# Patient Record
Sex: Male | Born: 1970 | Race: Black or African American | Hispanic: No | Marital: Married | Smoking: Never smoker
Health system: Southern US, Community
[De-identification: ages and names within clinical notes are randomized; demographics above are authoritative.]

## PROBLEM LIST (undated history)

## (undated) DIAGNOSIS — N400 Enlarged prostate without lower urinary tract symptoms: Secondary | ICD-10-CM

## (undated) DIAGNOSIS — I10 Essential (primary) hypertension: Secondary | ICD-10-CM

## (undated) DIAGNOSIS — E119 Type 2 diabetes mellitus without complications: Secondary | ICD-10-CM

## (undated) DIAGNOSIS — D66 Hereditary factor VIII deficiency: Secondary | ICD-10-CM

---

## 1997-12-09 ENCOUNTER — Emergency Department (HOSPITAL_COMMUNITY): Admission: EM | Admit: 1997-12-09 | Discharge: 1997-12-09 | Payer: Self-pay | Admitting: Emergency Medicine

## 1998-04-08 ENCOUNTER — Emergency Department (HOSPITAL_COMMUNITY): Admission: EM | Admit: 1998-04-08 | Discharge: 1998-04-08 | Payer: Self-pay | Admitting: Emergency Medicine

## 1998-04-23 ENCOUNTER — Emergency Department (HOSPITAL_COMMUNITY): Admission: EM | Admit: 1998-04-23 | Discharge: 1998-04-23 | Payer: Self-pay | Admitting: Emergency Medicine

## 2004-02-18 ENCOUNTER — Emergency Department (HOSPITAL_COMMUNITY): Admission: EM | Admit: 2004-02-18 | Discharge: 2004-02-18 | Payer: Self-pay | Admitting: Family Medicine

## 2004-04-03 ENCOUNTER — Emergency Department (HOSPITAL_COMMUNITY): Admission: EM | Admit: 2004-04-03 | Discharge: 2004-04-03 | Payer: Self-pay | Admitting: Emergency Medicine

## 2004-05-08 ENCOUNTER — Emergency Department (HOSPITAL_COMMUNITY): Admission: EM | Admit: 2004-05-08 | Discharge: 2004-05-08 | Payer: Self-pay | Admitting: Emergency Medicine

## 2009-02-06 ENCOUNTER — Emergency Department (HOSPITAL_COMMUNITY): Admission: EM | Admit: 2009-02-06 | Discharge: 2009-02-07 | Payer: Self-pay | Admitting: Emergency Medicine

## 2010-08-09 ENCOUNTER — Encounter: Payer: Self-pay | Admitting: Chiropractic Medicine

## 2019-12-11 ENCOUNTER — Encounter (HOSPITAL_COMMUNITY): Payer: Self-pay | Admitting: Emergency Medicine

## 2019-12-11 ENCOUNTER — Emergency Department (HOSPITAL_COMMUNITY)
Admission: EM | Admit: 2019-12-11 | Discharge: 2019-12-11 | Disposition: A | Discharge status: Home / Self Care | Payer: Self-pay | Attending: Emergency Medicine | Admitting: Emergency Medicine

## 2019-12-11 ENCOUNTER — Other Ambulatory Visit: Payer: Self-pay

## 2019-12-11 ENCOUNTER — Emergency Department (HOSPITAL_COMMUNITY): Payer: Self-pay

## 2019-12-11 DIAGNOSIS — R519 Headache, unspecified: Secondary | ICD-10-CM | POA: Insufficient documentation

## 2019-12-11 DIAGNOSIS — I10 Essential (primary) hypertension: Secondary | ICD-10-CM | POA: Insufficient documentation

## 2019-12-11 DIAGNOSIS — Z79899 Other long term (current) drug therapy: Secondary | ICD-10-CM | POA: Insufficient documentation

## 2019-12-11 HISTORY — DX: Essential (primary) hypertension: I10

## 2019-12-11 LAB — CBC
HCT: 49 % (ref 39.0–52.0)
Hemoglobin: 16.9 g/dL (ref 13.0–17.0)
MCH: 29.9 pg (ref 26.0–34.0)
MCHC: 34.5 g/dL (ref 30.0–36.0)
MCV: 86.6 fL (ref 80.0–100.0)
Platelets: 261 10*3/uL (ref 150–400)
RBC: 5.66 MIL/uL (ref 4.22–5.81)
RDW: 12.3 % (ref 11.5–15.5)
WBC: 6.5 10*3/uL (ref 4.0–10.5)
nRBC: 0 % (ref 0.0–0.2)

## 2019-12-11 LAB — BASIC METABOLIC PANEL
Anion gap: 7 (ref 5–15)
BUN: 15 mg/dL (ref 6–20)
CO2: 26 mmol/L (ref 22–32)
Calcium: 9.3 mg/dL (ref 8.9–10.3)
Chloride: 103 mmol/L (ref 98–111)
Creatinine, Ser: 1.32 mg/dL — ABNORMAL HIGH (ref 0.61–1.24)
GFR calc Af Amer: >60 mL/min (ref >60)
GFR calc non Af Amer: >60 mL/min (ref >60)
Glucose, Bld: 115 mg/dL — ABNORMAL HIGH (ref 70–99)
Potassium: 4.3 mmol/L (ref 3.5–5.1)
Sodium: 136 mmol/L (ref 135–145)

## 2019-12-11 MED ORDER — KETOROLAC TROMETHAMINE 15 MG/ML IJ SOLN
15.0000 mg | Freq: Once | INTRAMUSCULAR | Status: AC
  Administered 2019-12-11: 15 mg via INTRAVENOUS
  Filled 2019-12-11: qty 1

## 2019-12-11 MED ORDER — METOCLOPRAMIDE HCL 5 MG/ML IJ SOLN
10.0000 mg | Freq: Once | INTRAMUSCULAR | Status: AC
  Administered 2019-12-11: 10 mg via INTRAVENOUS
  Filled 2019-12-11: qty 2

## 2019-12-11 MED ORDER — DIPHENHYDRAMINE HCL 50 MG/ML IJ SOLN
25.0000 mg | Freq: Once | INTRAMUSCULAR | Status: AC
  Administered 2019-12-11: 25 mg via INTRAVENOUS
  Filled 2019-12-11: qty 1

## 2019-12-11 NOTE — ED Triage Notes (Signed)
Pt reports his BP has been high, hx HTN. States he takes toprol and is prescribed lasix but has not been taking. C/o headache, A&O x 4, no neuro deficits noted.

## 2019-12-11 NOTE — ED Notes (Signed)
Ambulated pt in hallway with no difficulties. Pt denies dizziness pt states only having headache. HR 80, 02 100% RA.

## 2019-12-11 NOTE — Discharge Instructions (Signed)
Please read and follow all provided instructions.  Your diagnoses today include:  1. Acute nonintractable headache, unspecified headache type   2. Essential hypertension     Tests performed today include:  CT of your head which was normal and did not show any serious cause of your headache  Blood counts and electrolytes - your kidney function test was just slightly weak  Vital signs. See below for your results today.   Medications:  In the Emergency Department you received:  Reglan - antinausea/headache medication  Benadryl - antihistamine to counteract potential side effects of reglan  Toradol - NSAID medication similar to ibuprofen  Take any prescribed medications only as directed.  Additional information:  Follow any educational materials contained in this packet.  You are having a headache. No specific cause was found today for your headache. It may have been a migraine or other cause of headache. Stress, anxiety, fatigue, and depression are common triggers for headaches.   Your headache today does not appear to be life-threatening or require hospitalization, but often the exact cause of headaches is not determined in the emergency department. Therefore, follow-up with your doctor is very important to find out what may have caused your headache and whether or not you need any further diagnostic testing or treatment.   Sometimes headaches can appear benign (not harmful), but then more serious symptoms can develop which should prompt an immediate re-evaluation by your doctor or the emergency department.  BE VERY CAREFUL not to take multiple medicines containing Tylenol (also called acetaminophen). Doing so can lead to an overdose which can damage your liver and cause liver failure and possibly death.   Follow-up instructions: Please follow-up with your primary care provider in the next 3 days for further evaluation of your symptoms.   Return instructions:   Please return  to the Emergency Department if you experience worsening symptoms.  Return if the medications do not resolve your headache, if it recurs, or if you have multiple episodes of vomiting or cannot keep down fluids.  Return if you have a change from the usual headache.  RETURN IMMEDIATELY IF you:  Develop a sudden, severe headache  Develop confusion or become poorly responsive or faint  Develop a fever above 100.75F or problem breathing  Have a change in speech, vision, swallowing, or understanding  Develop new weakness, numbness, tingling, incoordination in your arms or legs  Have a seizure  Please return if you have any other emergent concerns.  Additional Information:  Your vital signs today were: BP 123/78    Pulse (!) 51    Temp 97.7 F (36.5 C) (Oral)    Resp 20    SpO2 100%  If your blood pressure (BP) was elevated above 135/85 this visit, please have this repeated by your doctor within one month. --------------

## 2019-12-11 NOTE — ED Provider Notes (Signed)
[facility] EMERGENCY DEPARTMENT Provider Note   CSN: [id number] Arrival date & time: 12/11/19  0246     History Chief Complaint  Patient presents with  . Hypertension    Charles Farmer is a 49 y.o. male.  Charles Farmer is a 49 year old male with history of hypertension and hemophilia presents to the emergency department today for severe headache and elevated blood pressures.  Patient reports that since getting out of jail on March 5, he has had multiple headaches.  He relates these to having high blood pressure.  Last night he had a severe headache that woke him from sleep.  He describes the headache as stabbing in the front of the head.  He does not have any associated light or sound sensitivities, or nausea.  No vision changes.  No weakness, numbness, or tingling in the arms of the legs.  He is able to walk.  He drove himself to the emergency department today.  Previously he was prescribed metoprolol and Lasix.  He has been taking the metoprolol consistently but states that he had stopped taking Lasix over concerns of his kidney function and potassium which have not been rechecked since being in prison.  Patient has had similar headaches in the past however they have been more frequent and severe recently.  He denies any fevers, confusion, neck pain.  No chest pain or shortness of breath.  No abdominal pain or diarrhea.  He denies easy bruising or bleeding, blood in the stool or urine.  He states that he took his blood pressure medication prior to arriving in the ED this morning.        Past Medical History:  Diagnosis Date  . Hypertension     There are no problems to display for this patient.   History reviewed. No pertinent surgical history.     No family history on file.  Social History   Tobacco Use  . Smoking status: Not on file  Substance Use Topics  . Alcohol use: Not Currently  . Drug use: Not Currently    Home Medications Prior to Admission  medications   Not on File    Allergies    Codeine  Review of Systems   Review of Systems  Constitutional: Negative for fever.  HENT: Negative for congestion, dental problem, rhinorrhea and sinus pressure.   Eyes: Negative for photophobia, discharge, redness and visual disturbance.  Respiratory: Negative for shortness of breath.   Cardiovascular: Negative for chest pain.  Gastrointestinal: Negative for nausea and vomiting.  Musculoskeletal: Negative for gait problem, neck pain and neck stiffness.  Skin: Negative for rash.  Neurological: Positive for headaches. Negative for syncope, speech difficulty, weakness, light-headedness and numbness.  Psychiatric/Behavioral: Negative for confusion.    Physical Exam Updated Vital Signs BP (!) 153/100   Pulse (!) 52   Temp 97.7 F (36.5 C) (Oral)   Resp 17   SpO2 100%   Physical Exam Vitals and nursing note reviewed.  Constitutional:      Appearance: He is well-developed.  HENT:     Head: Normocephalic and atraumatic.     Right Ear: Tympanic membrane, ear canal and external ear normal.     Left Ear: Tympanic membrane, ear canal and external ear normal.     Nose: Nose normal.     Mouth/Throat:     Pharynx: Uvula midline.  Eyes:     General: Lids are normal.     Conjunctiva/sclera: Conjunctivae normal.     Pupils: Pupils  are equal, round, and reactive to light.  Cardiovascular:     Rate and Rhythm: Regular rhythm. Bradycardia present.     Comments: Mild bradycardia in 50s Pulmonary:     Effort: Pulmonary effort is normal.     Breath sounds: Normal breath sounds.  Abdominal:     Palpations: Abdomen is soft.     Tenderness: There is no abdominal tenderness.  Musculoskeletal:        General: Normal range of motion.     Cervical back: Normal range of motion and neck supple. No tenderness or bony tenderness.  Skin:    General: Skin is warm and dry.  Neurological:     Mental Status: He is alert and oriented to person, place,  and time.     GCS: GCS eye subscore is 4. GCS verbal subscore is 5. GCS motor subscore is 6.     Cranial Nerves: No cranial nerve deficit.     Sensory: No sensory deficit.     Motor: No abnormal muscle tone.     Coordination: Coordination normal.     Gait: Gait normal.     Deep Tendon Reflexes: Reflexes are normal and symmetric.     ED Results / Procedures / Treatments   Labs (all labs ordered are listed, but only abnormal results are displayed) Labs Reviewed  BASIC METABOLIC PANEL - Abnormal; Notable for the following components:      Result Value   Glucose, Bld 115 (*)    Creatinine, Ser 1.32 (*)    All other components within normal limits  CBC    EKG None  Radiology CT Head Wo Contrast  Result Date: 12/11/2019 CLINICAL DATA:  Frontal headache. Hypertension. No reported injury. Hemophilia. EXAM: CT HEAD WITHOUT CONTRAST TECHNIQUE: Contiguous axial images were obtained from the base of the skull through the vertex without intravenous contrast. COMPARISON:  02/07/2009 head CT. FINDINGS: Brain: No evidence of parenchymal hemorrhage or extra-axial fluid collection. No mass lesion, mass effect, or midline shift. No CT evidence of acute infarction. Tiny nonspecific focus of white matter hypodensity in deep left parietal lobe is not definitely changed since 2010, of doubtful clinical significance given chronicity and stability. Cerebral volume is age appropriate. No ventriculomegaly. Vascular: No acute abnormality. Skull: No evidence of calvarial fracture. Sinuses/Orbits: The visualized paranasal sinuses are essentially clear. Other:  The mastoid air cells are unopacified. IMPRESSION: Negative head CT. No evidence of acute intracranial abnormality. Electronically Signed   By: Ilona Sorrel M.D.   On: 12/11/2019 09:20    Procedures Procedures (including critical care time)  Medications Ordered in ED Medications  metoCLOPramide (REGLAN) injection 10 mg (10 mg Intravenous Given 12/11/19  0828)  diphenhydrAMINE (BENADRYL) injection 25 mg (25 mg Intravenous Given 12/11/19 0828)  ketorolac (TORADOL) 15 MG/ML injection 15 mg (15 mg Intravenous Given 12/11/19 1035)    ED Course  I have reviewed the triage vital signs and the nursing notes.  Pertinent labs & imaging results that were available during my care of the patient were reviewed by me and considered in my medical decision making (see chart for details).  Patient seen and examined.  Will treat headache with Reglan and Benadryl.  Will check lab work given intermittent use of Lasix.  Will check head CT given change in frequency and severity of headache, hemophilia, hypertension, severe pain awakening him from sleep.  Patient does not have any signs of meningitis or meningeal irritation on exam and looks quite well with a normal neuro exam.  Vital signs reviewed and are as follows: BP (!) 153/100   Pulse (!) 52   Temp 97.7 F (36.5 C) (Oral)   Resp 17   SpO2 100%   11:06 AM patient noted some improvement after Reglan and Benadryl.  Additional Toradol was given after CT results return.  Patient updated on results.  He is resting comfortably in the room.  Patient rechecked.  Blood pressure is improved to 123/78 at last recheck.  Patient states that he is working to arrange primary care follow-up currently.  Plan for discharged home.  Encouraged to continue home blood pressure medications.    MDM Rules/Calculators/A&P                      HA: Improved with treatment. Patient has a normal complete neurological exam, normal vital signs, normal level of consciousness, no signs of meningismus or infection, is well-appearing/non-toxic appearing, no signs of trauma. Doubt sentinel bleeding due to history of similar HA in past. Doubt meningitis.   CT imaging is negative.   No dangerous or life-threatening conditions suspected or identified by history, physical exam, and by work-up. No indications for hospitalization identified.    Elevated BP: Patient with mild elevation in creatinine.  Normal potassium.  Blood pressure improved spontaneously with treatment of headache.  Patient looks well.  No indications of significant acute endorgan damage to blood pressure.   Final Clinical Impression(s) / ED Diagnoses Final diagnoses:  Acute nonintractable headache, unspecified headache type  Essential hypertension    Rx / DC Orders ED Discharge Orders    None       Renne Crigler, PA-C 12/11/19 1113    Derwood Kaplan, MD 12/13/19 1517

## 2020-04-11 ENCOUNTER — Ambulatory Visit (HOSPITAL_COMMUNITY)
Admission: EM | Admit: 2020-04-11 | Discharge: 2020-04-11 | Disposition: A | Discharge status: Home / Self Care | Payer: 59

## 2020-04-11 ENCOUNTER — Encounter (HOSPITAL_COMMUNITY): Payer: Self-pay

## 2020-04-11 ENCOUNTER — Other Ambulatory Visit: Payer: Self-pay

## 2020-04-11 DIAGNOSIS — H00012 Hordeolum externum right lower eyelid: Secondary | ICD-10-CM | POA: Diagnosis not present

## 2020-04-11 MED ORDER — ERYTHROMYCIN 5 MG/GM OP OINT: TOPICAL_OINTMENT | OPHTHALMIC | 0 refills | Status: DC

## 2020-04-11 NOTE — ED Provider Notes (Signed)
[facility]    CSN: [id number] Arrival date & time: 04/11/20  1557      History   Chief Complaint Chief Complaint  Patient presents with  . Abscess    HPI Charles Farmer is a 49 y.o. male.   Here today with 4 day hx of stye on right lower eyelid. States he's been using warm compresses and OTC eyedrops and finally yesterday the stye bursted and drained blood and pus. He is now having itching and tenderness in the area. Denies fever, chills, visual changes, eye redness or pain, headache, dizziness.      Past Medical History:  Diagnosis Date  . Hypertension     There are no problems to display for this patient.   History reviewed. No pertinent surgical history.     Home Medications    Prior to Admission medications   Medication Sig Start Date End Date Taking? Authorizing Provider  furosemide (LASIX) 20 MG tablet Take 20 mg by mouth. 01/16/20  Yes [provider]  metoprolol succinate (TOPROL-XL) 50 MG 24 hr tablet Take 50 mg by mouth. 01/16/20  Yes [provider]  erythromycin ophthalmic ointment Place a 1/2 inch ribbon of ointment into the lower eyelid twice daily until resolved 04/11/20   Particia Nearing, PA-C    Family History No family history on file.  Social History Social History   Tobacco Use  . Smoking status: Never Smoker  . Smokeless tobacco: Never Used  Substance Use Topics  . Alcohol use: Not Currently  . Drug use: Not Currently     Allergies   Codeine   Review of Systems Review of Systems PER HPI   Physical Exam Triage Vital Signs ED Triage Vitals  Enc Vitals Group     BP 04/11/20 1704 (!) 151/104     Pulse Rate 04/11/20 1704 77     Resp 04/11/20 1704 18     Temp 04/11/20 1704 97.8 F (36.6 C)     Temp Source 04/11/20 1704 Oral     SpO2 04/11/20 1704 99 %     Weight 04/11/20 1707 (!) 315 lb (142.9 kg)     Height 04/11/20 1707 5\' 8"  (1.727 m)     Head Circumference --      Peak Flow --      Pain  Score 04/11/20 1707 5     Pain Loc --      Pain Edu? --      Excl. in GC? --    No data found.  Updated Vital Signs BP (!) 151/104   Pulse 77   Temp 97.8 F (36.6 C) (Oral)   Resp 18   Ht 5\' 8"  (1.727 m)   Wt (!) 315 lb (142.9 kg)   SpO2 99%   BMI 47.90 kg/m   Visual Acuity Right Eye Distance:   Left Eye Distance:   Bilateral Distance:    Right Eye Near:   Left Eye Near:    Bilateral Near:     Physical Exam Vitals and nursing note reviewed.  Constitutional:      Appearance: Normal appearance.  HENT:     Head: Atraumatic.  Eyes:     Extraocular Movements: Extraocular movements intact.     Conjunctiva/sclera: Conjunctivae normal.     Comments: Mildly erythematous stye with dried crusting around it right lower eyelid. Not actively draining.   Cardiovascular:     Rate and Rhythm: Normal rate and regular rhythm.  Pulmonary:  Effort: Pulmonary effort is normal.     Breath sounds: Normal breath sounds.  Musculoskeletal:        General: Normal range of motion.     Cervical back: Normal range of motion and neck supple.  Skin:    General: Skin is warm and dry.  Neurological:     General: No focal deficit present.     Mental Status: He is oriented to person, place, and time.  Psychiatric:        Mood and Affect: Mood normal.        Thought Content: Thought content normal.        Judgment: Judgment normal.    UC Treatments / Results  Labs (all labs ordered are listed, but only abnormal results are displayed) Labs Reviewed - No data to display  EKG   Radiology No results found.  Procedures Procedures (including critical care time)  Medications Ordered in UC Medications - No data to display  Initial Impression / Assessment and Plan / UC Course  I have reviewed the triage vital signs and the nursing notes.  Pertinent labs & imaging results that were available during my care of the patient were reviewed by me and considered in my medical decision making  (see chart for details).     Stye, right lower eyelid  Has already drained majority of contents and is improving per patient. Continue warm compresses, add erythromycin ointment BID prn until area resolves to avoid reinfection while open. May return to work tomorrow as scheduled unless sxs worsening. Return precautions reviewed.   Final Clinical Impressions(s) / UC Diagnoses   Final diagnoses:  Hordeolum externum of right lower eyelid   Discharge Instructions   None    ED Prescriptions    Medication Sig Dispense Auth. Provider   erythromycin ophthalmic ointment Place a 1/2 inch ribbon of ointment into the lower eyelid twice daily until resolved 3.5 g Particia Nearing, PA-C     PDMP not reviewed this encounter.   Particia Nearing, [place] 04/11/20 1745

## 2020-04-11 NOTE — ED Triage Notes (Signed)
Pt has a small bump near his right eyex1 wk. Pt states yesterday that bump popped.

## 2020-05-30 ENCOUNTER — Other Ambulatory Visit: Payer: Self-pay

## 2020-05-30 ENCOUNTER — Encounter (HOSPITAL_COMMUNITY): Payer: Self-pay

## 2020-05-30 ENCOUNTER — Ambulatory Visit (HOSPITAL_COMMUNITY)
Admission: EM | Admit: 2020-05-30 | Discharge: 2020-05-30 | Disposition: A | Discharge status: Home / Self Care | Payer: 59 | Attending: Family Medicine | Admitting: Family Medicine

## 2020-05-30 DIAGNOSIS — B349 Viral infection, unspecified: Secondary | ICD-10-CM | POA: Insufficient documentation

## 2020-05-30 DIAGNOSIS — Z1152 Encounter for screening for COVID-19: Secondary | ICD-10-CM | POA: Diagnosis not present

## 2020-05-30 LAB — SARS CORONAVIRUS 2 (TAT 6-24 HRS): SARS Coronavirus 2: NEGATIVE

## 2020-05-30 NOTE — ED Triage Notes (Signed)
Pt present coughing, headache and sore throat with loss appetite. Symptoms started yesterday. Pt states he tried OTC medication with no relief.

## 2020-05-30 NOTE — Discharge Instructions (Signed)
OTC medicines as needed  Covid test pending.  You can check my chart for results.  Follow up as needed for continued or worsening symptoms

## 2020-05-30 NOTE — ED Provider Notes (Signed)
[facility]    CSN: [id number] Arrival date & time: 05/30/20  1505      History   Chief Complaint Chief Complaint  Patient presents with  . Sore Throat  . Headache    HPI Kiyaan Haq is a 49 y.o. male.   Pt is a 49 year old male that presented with headache, cough.,  Loss of appetite.  This is been present consistently.  Over-the-counter medication without much relief.  No fevers, chills, body aches, nausea, vomiting or diarrhea.     Past Medical History:  Diagnosis Date  . Hypertension     There are no problems to display for this patient.   History reviewed. No pertinent surgical history.     Home Medications    Prior to Admission medications   Medication Sig Start Date End Date Taking? Authorizing Provider  erythromycin ophthalmic ointment Place a 1/2 inch ribbon of ointment into the lower eyelid twice daily until resolved 04/11/20   Particia Nearing, PA-C  furosemide (LASIX) 20 MG tablet Take 20 mg by mouth. 01/16/20   [provider]  metoprolol succinate (TOPROL-XL) 50 MG 24 hr tablet Take 50 mg by mouth. 01/16/20   [provider]    Family History History reviewed. No pertinent family history.  Social History Social History   Tobacco Use  . Smoking status: Never Smoker  . Smokeless tobacco: Never Used  Substance Use Topics  . Alcohol use: Not Currently  . Drug use: Not Currently     Allergies   Codeine   Review of Systems Review of Systems   Physical Exam Triage Vital Signs ED Triage Vitals  Enc Vitals Group     BP 05/30/20 1528 (!) 171/124     Pulse Rate 05/30/20 1528 81     Resp 05/30/20 1528 18     Temp 05/30/20 1528 98.8 F (37.1 C)     Temp Source 05/30/20 1528 Oral     SpO2 05/30/20 1528 98 %     Weight --      Height --      Head Circumference --      Peak Flow --      Pain Score 05/30/20 1529 7     Pain Loc --      Pain Edu? --      Excl. in GC? --    No data found.  Updated Vital  Signs BP (!) 171/124 (BP Location: Right Arm)   Pulse 81   Temp 98.8 F (37.1 C) (Oral)   Resp 18   SpO2 98%   Visual Acuity Right Eye Distance:   Left Eye Distance:   Bilateral Distance:    Right Eye Near:   Left Eye Near:    Bilateral Near:     Physical Exam Vitals and nursing note reviewed.  Constitutional:      Appearance: Normal appearance.  HENT:     Head: Normocephalic and atraumatic.     Right Ear: Tympanic membrane and ear canal normal.     Left Ear: There is impacted cerumen.     Nose: Nose normal.     Mouth/Throat:     Pharynx: Oropharynx is clear.  Eyes:     Conjunctiva/sclera: Conjunctivae normal.  Pulmonary:     Effort: Pulmonary effort is normal.  Musculoskeletal:        General: Normal range of motion.     Cervical back: Normal range of motion.  Skin:    General: Skin  is warm and dry.  Neurological:     Mental Status: He is alert.  Psychiatric:        Mood and Affect: Mood normal.      UC Treatments / Results  Labs (all labs ordered are listed, but only abnormal results are displayed) Labs Reviewed  SARS CORONAVIRUS 2 (TAT 6-24 HRS)    EKG   Radiology No results found.  Procedures Procedures (including critical care time)  Medications Ordered in UC Medications - No data to display  Initial Impression / Assessment and Plan / UC Course  I have reviewed the triage vital signs and the nursing notes.  Pertinent labs & imaging results that were available during my care of the patient were reviewed by me and considered in my medical decision making (see chart for details).     Viral illness covid test pending.  OTC meds as needed Follow up as needed for continued or worsening symptoms  Final Clinical Impressions(s) / UC Diagnoses   Final diagnoses:  Encounter for screening for COVID-19  Viral illness     Discharge Instructions     OTC medicines as needed  Covid test pending.  You can check my chart for results.  Follow  up as needed for continued or worsening symptoms     ED Prescriptions    None     PDMP not reviewed this encounter.   Janace Aris, NP 05/30/20 1607

## 2021-09-26 IMAGING — CT CT HEAD W/O CM
3 series · 15 of 47 positions shown, 18 images · non-contrast
Comparison: 02/07/2009 head CT.

CLINICAL DATA: Frontal headache. Hypertension. No reported injury.
Hemophilia.

EXAM:
CT HEAD WITHOUT CONTRAST
TECHNIQUE: Contiguous axial images were obtained from the base of the skull
through the vertex without intravenous contrast.

[Series 3: head 5.0 h30s · axial · 0.44mm/px · z∈[-87,+58]mm · 9 of 35 slices shown, 12 images]
[im 3/35  brain]
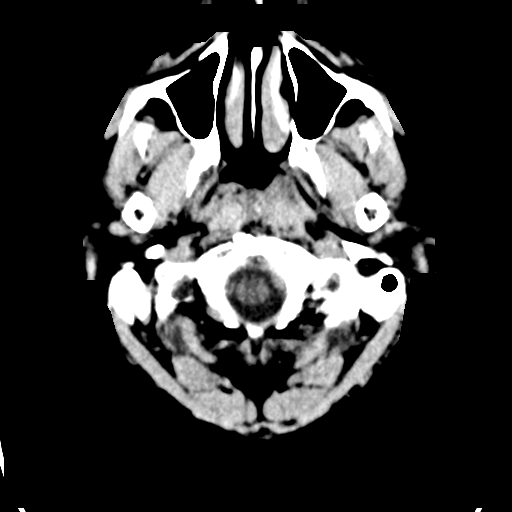
[im 3/35  bone]
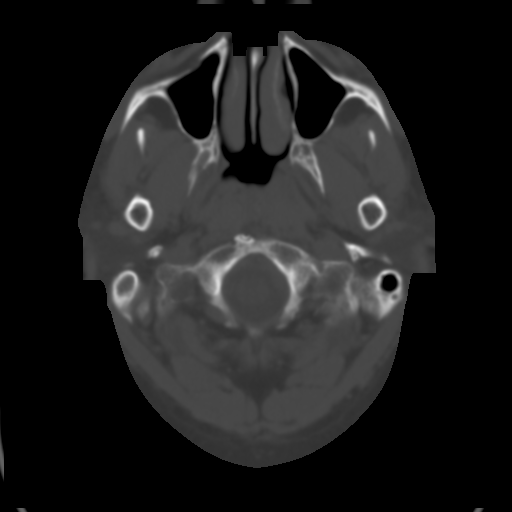
[im 6/35  brain]
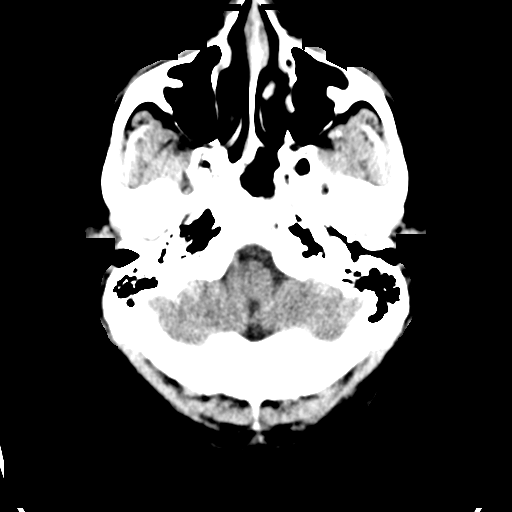
[im 10/35  brain]
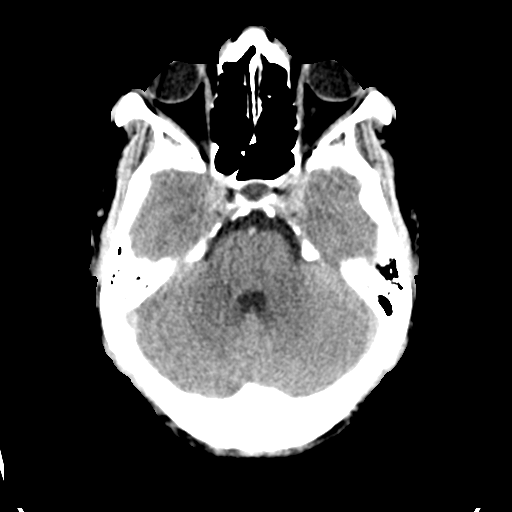
[im 13/35  brain]
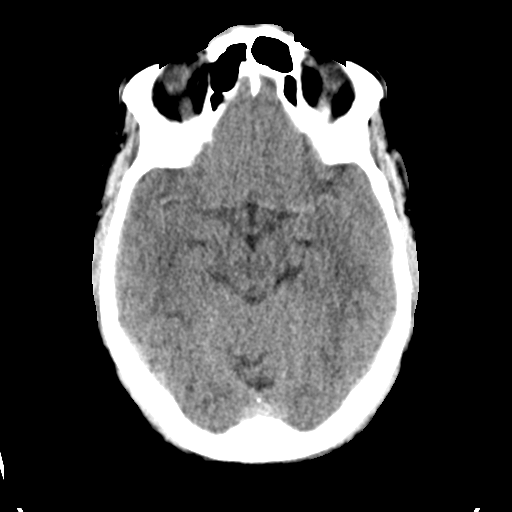
[im 18/35  brain]
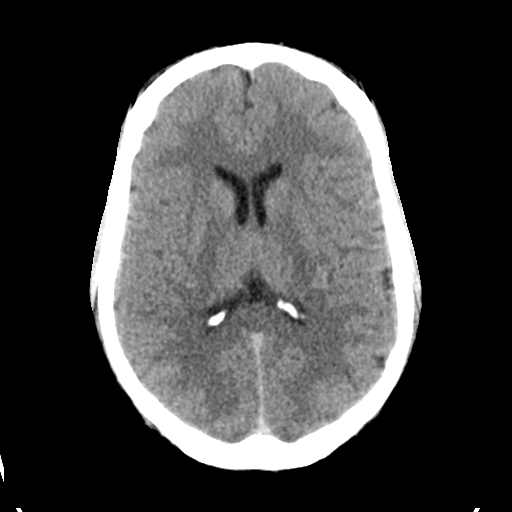
[im 18/35  bone]
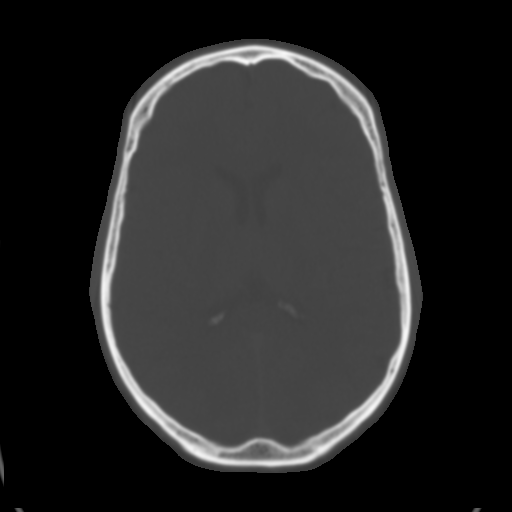
[im 22/35  brain]
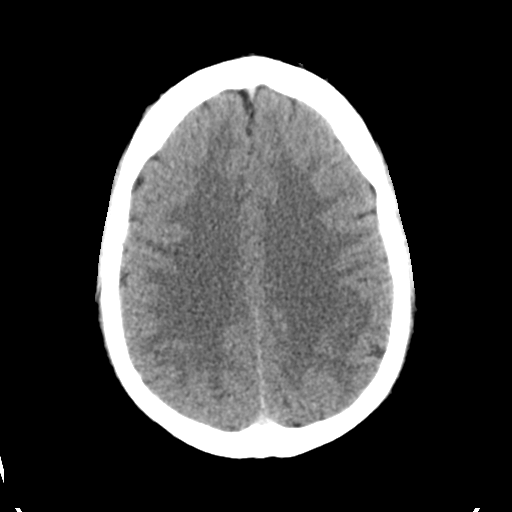
[im 25/35  brain]
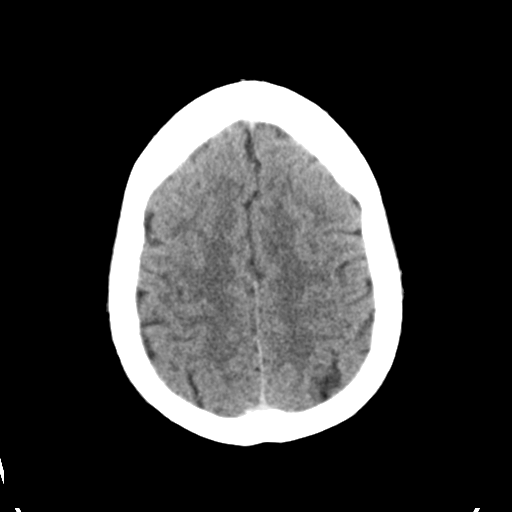
[im 29/35  brain]
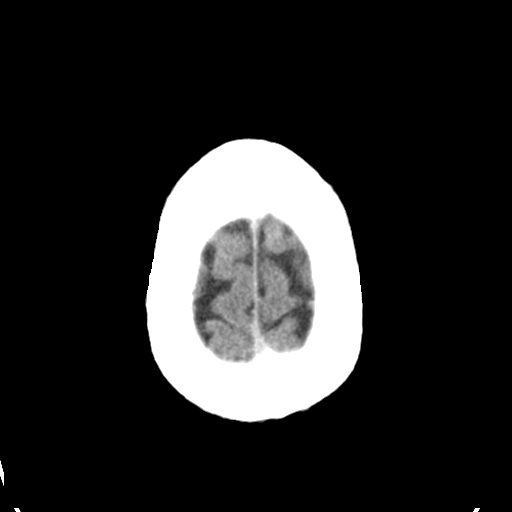
[im 32/35  brain]
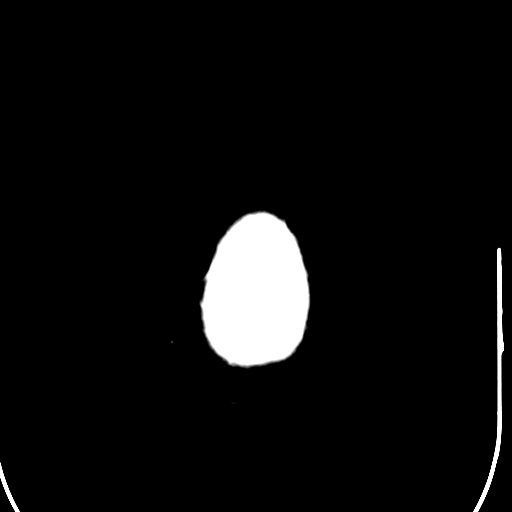
[im 32/35  bone]
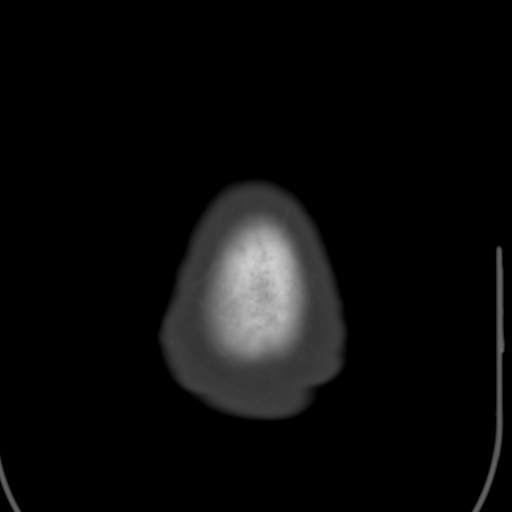

[Series 5: head 3.0 mpr cor · coronal · 0.34mm/px · 3 of 69 slices shown]
[im 23/69  brain]
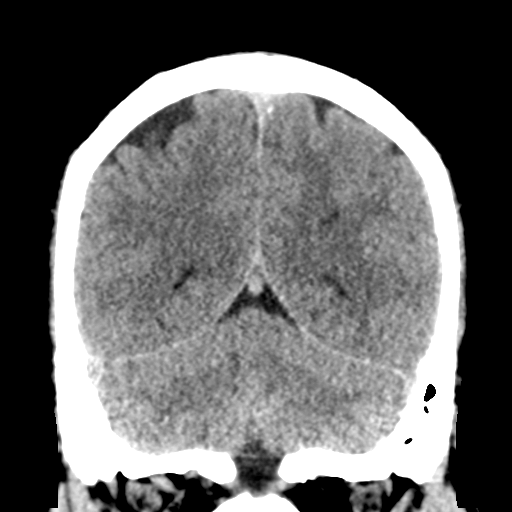
[im 31/69  brain]
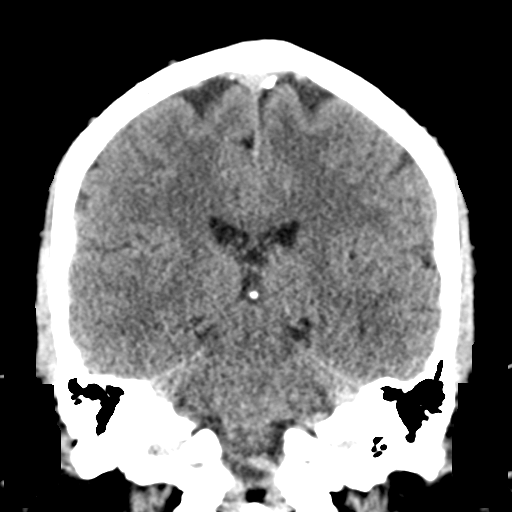
[im 38/69  brain]
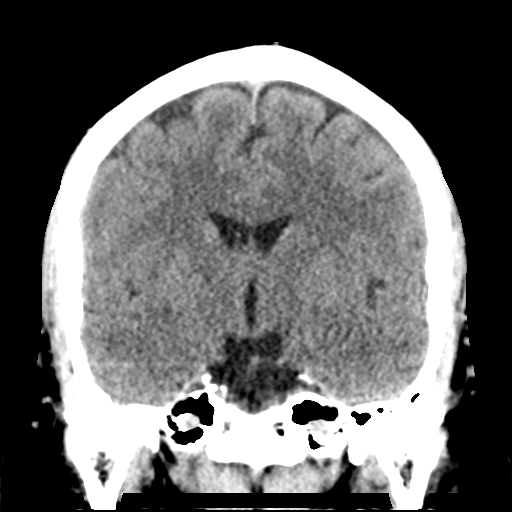

[Series 6: head 3.0 mpr sag · sagittal · 0.34mm/px · 3 of 57 slices shown]
[im 19/57  brain]
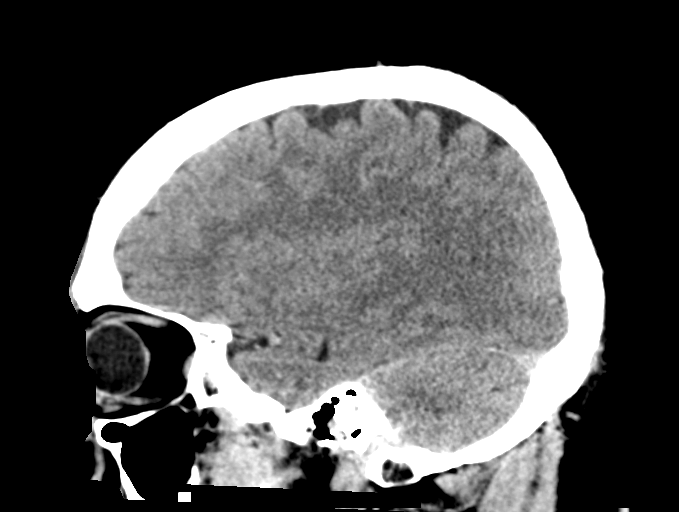
[im 29/57  brain]
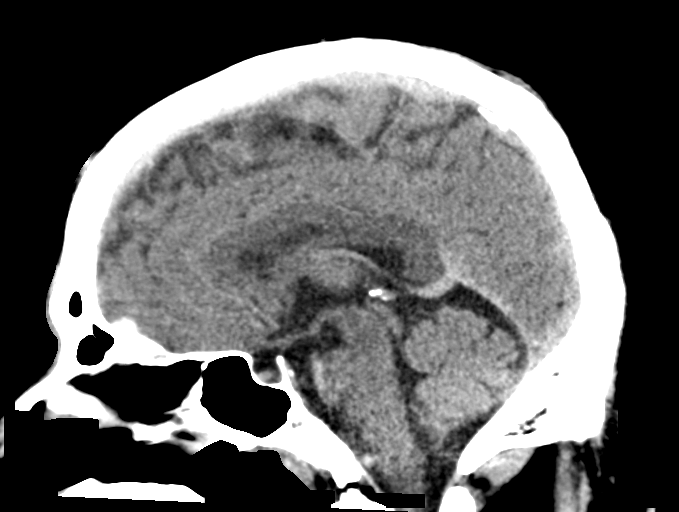
[im 38/57  brain]
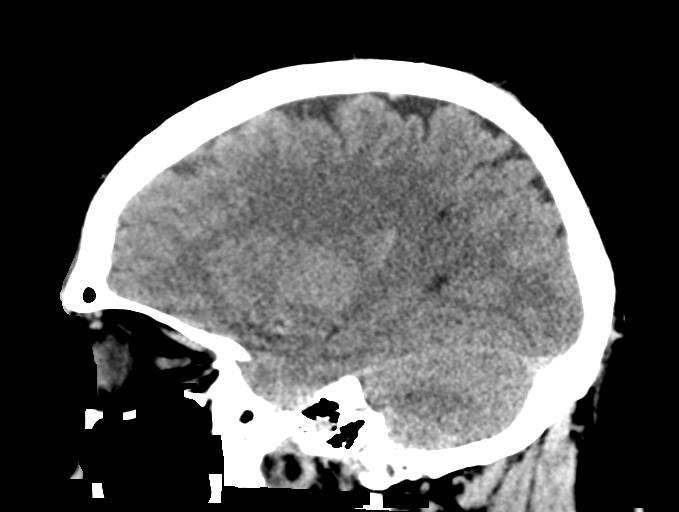

[15 of 47 positions shown; findings below may reference images not displayed]

FINDINGS: Brain: No evidence of parenchymal hemorrhage or extra-axial fluid
collection. No mass lesion, mass effect, or midline shift. No CT
evidence of acute infarction. Tiny nonspecific focus of white matter
hypodensity in deep left parietal lobe is not definitely changed
since 9656, of doubtful clinical significance given chronicity and
stability. Cerebral volume is age appropriate. No ventriculomegaly.

Vascular: No acute abnormality.

Skull: No evidence of calvarial fracture.

Sinuses/Orbits: The visualized paranasal sinuses are essentially
clear.

Other:  The mastoid air cells are unopacified.
IMPRESSION: Negative head CT. No evidence of acute intracranial abnormality.

## 2022-05-07 ENCOUNTER — Encounter (HOSPITAL_BASED_OUTPATIENT_CLINIC_OR_DEPARTMENT_OTHER): Payer: Self-pay | Admitting: Pediatrics

## 2022-05-07 ENCOUNTER — Other Ambulatory Visit: Payer: Self-pay

## 2022-05-07 ENCOUNTER — Emergency Department (HOSPITAL_BASED_OUTPATIENT_CLINIC_OR_DEPARTMENT_OTHER)
Admission: EM | Admit: 2022-05-07 | Discharge: 2022-05-07 | Disposition: A | Discharge status: Home / Self Care | Payer: Worker's Compensation | Attending: Emergency Medicine | Admitting: Emergency Medicine

## 2022-05-07 DIAGNOSIS — S0181XA Laceration without foreign body of other part of head, initial encounter: Secondary | ICD-10-CM | POA: Diagnosis not present

## 2022-05-07 DIAGNOSIS — W208XXA Other cause of strike by thrown, projected or falling object, initial encounter: Secondary | ICD-10-CM | POA: Diagnosis not present

## 2022-05-07 DIAGNOSIS — Y9389 Activity, other specified: Secondary | ICD-10-CM | POA: Insufficient documentation

## 2022-05-07 DIAGNOSIS — S0990XA Unspecified injury of head, initial encounter: Secondary | ICD-10-CM | POA: Diagnosis present

## 2022-05-07 DIAGNOSIS — R030 Elevated blood-pressure reading, without diagnosis of hypertension: Secondary | ICD-10-CM

## 2022-05-07 MED ORDER — LIDOCAINE HCL (PF) 1 % IJ SOLN
5.0000 mL | Freq: Once | INTRAMUSCULAR | Status: DC
  Filled 2022-05-07: qty 5

## 2022-05-07 MED ORDER — LIDOCAINE-EPINEPHRINE 2 %-1:100000 IJ SOLN: 20.0000 mL | Freq: Once | INTRAMUSCULAR | Status: DC

## 2022-05-07 NOTE — ED Provider Notes (Signed)
[facility] EMERGENCY DEPARTMENT Provider Note   CSN: [id number] Arrival date & time: 05/07/22  1659     History  Chief Complaint  Patient presents with   Laceration    Charles Farmer is a 51 y.o. male brought in by his boss for evaluation of injury to the left side of the face.  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  He suffered a small laceration.  Is up-to-date on his tetanus vaccination.  He denies any eye pain, loss of consciousness.   Laceration      Home Medications Prior to Admission medications   Medication Sig Start Date End Date Taking? Authorizing Provider  erythromycin ophthalmic ointment Place a 1/2 inch ribbon of ointment into the lower eyelid twice daily until resolved 04/11/20   Volney American, PA-C  furosemide (LASIX) 20 MG tablet Take 20 mg by mouth. 01/16/20   [provider]  metoprolol succinate (TOPROL-XL) 50 MG 24 hr tablet Take 50 mg by mouth. 01/16/20   [provider]      Allergies    Codeine    Review of Systems   Review of Systems  Physical Exam Updated Vital Signs BP (!) 167/129 (BP Location: Left Arm)   Pulse 78   Temp 98.7 F (37.1 C) (Oral)   Resp 20   Ht 5\' 8"  (1.727 m)   Wt 131.5 kg   SpO2 98%   BMI 44.09 kg/m  Physical Exam Vitals and nursing note reviewed.  Constitutional:      General: He is not in acute distress.    Appearance: He is well-developed. He is not diaphoretic.  HENT:     Head: Normocephalic.     Comments: Small hematoma over the left temple area, there is a less than 1 cm [place] laceration which is not gaping.  No active bleeding and does not appear to need any form of repair Eyes:     General: No scleral icterus.    Extraocular Movements: Extraocular movements intact.     Conjunctiva/sclera: Conjunctivae normal.     Comments: Moves eyes without pain,  no sign of fracture or entrapment  Cardiovascular:     Rate and Rhythm: Normal rate and regular rhythm.     Heart sounds: Normal heart sounds.  Pulmonary:     Effort: Pulmonary effort is normal. No respiratory distress.     Breath sounds: Normal breath sounds.  Abdominal:     Palpations: Abdomen is soft.     Tenderness: There is no abdominal tenderness.  Musculoskeletal:     Cervical back: Normal range of motion and neck supple.  Skin:    General: Skin is warm and dry.  Neurological:     Mental Status: He is alert.  Psychiatric:        Behavior: Behavior normal.     ED Results / Procedures / Treatments   Labs (all labs ordered are listed, but only abnormal results are displayed) Labs Reviewed - No data to display  EKG None  Radiology No results found.  Procedures Procedures    Medications Ordered in ED Medications  lidocaine (PF) (XYLOCAINE) 1 % injection 5 mL (5 mLs Intradermal Patient Refused/Not Given 05/07/22 2104)    ED Course/ Medical Decision Making/ A&P  Medical Decision Making Patient with small cut to the left temple this laceration does not appear to need any form of repair.  Wound cleaned and antibiotic ointment and dressing placed.  He is advised to apply ice to the hematoma.  No signs of entrapment or eye injury.  No evidence of facial fracture on clinical examination.  Notably hypertensive.  Advised to follow with PCP for further management.  Discussed outpatient follow-up, wound care and return precautions.  Risk Prescription drug management.           Final Clinical Impression(s) / ED Diagnoses Final diagnoses:  Facial laceration, initial encounter  Elevated blood pressure reading    Rx / DC Orders ED Discharge Orders     None         Margarita Mail, PA-C 05/07/22 2148    Leanord Asal K, DO 05/08/22 0023

## 2022-05-07 NOTE — Discharge Instructions (Signed)
Clean your wound twice daily with antibacterial soap and water.  You may apply Neosporin or other wound salve. Keep the area covered and clean. Get help right away if: Your pain suddenly increases and is severe. You develop severe swelling around the wound. The wound is on your hand or foot, and you cannot properly move a finger or toe. The wound is on your hand or foot, and you notice that your fingers or toes look pale or bluish. You have a red streak going away from your wound. You develop painful lumps near the wound or on skin anywhere else on your body.

## 2022-05-07 NOTE — ED Triage Notes (Signed)
C/O 1 cm laceration on left temporal side; from an air nozzle occurred 2 hrs ago; stated up to date on tetanus shot.

## 2022-05-21 ENCOUNTER — Encounter (HOSPITAL_COMMUNITY): Payer: Self-pay

## 2022-05-21 ENCOUNTER — Other Ambulatory Visit: Payer: Self-pay

## 2022-05-21 ENCOUNTER — Emergency Department (HOSPITAL_BASED_OUTPATIENT_CLINIC_OR_DEPARTMENT_OTHER)
Admission: EM | Admit: 2022-05-21 | Discharge: 2022-05-21 | Disposition: A | Discharge status: Other Acute Inpatient Hospital | Payer: Commercial Managed Care - HMO | Attending: Emergency Medicine | Admitting: Emergency Medicine

## 2022-05-21 ENCOUNTER — Emergency Department (HOSPITAL_BASED_OUTPATIENT_CLINIC_OR_DEPARTMENT_OTHER): Payer: Commercial Managed Care - HMO

## 2022-05-21 DIAGNOSIS — N401 Enlarged prostate with lower urinary tract symptoms: Secondary | ICD-10-CM | POA: Insufficient documentation

## 2022-05-21 DIAGNOSIS — R319 Hematuria, unspecified: Secondary | ICD-10-CM

## 2022-05-21 DIAGNOSIS — N3001 Acute cystitis with hematuria: Secondary | ICD-10-CM | POA: Diagnosis not present

## 2022-05-21 DIAGNOSIS — N390 Urinary tract infection, site not specified: Secondary | ICD-10-CM

## 2022-05-21 DIAGNOSIS — N400 Enlarged prostate without lower urinary tract symptoms: Secondary | ICD-10-CM

## 2022-05-21 LAB — CBC
HCT: 46.2 % (ref 39.0–52.0)
Hemoglobin: 15.9 g/dL (ref 13.0–17.0)
MCH: 30.2 pg (ref 26.0–34.0)
MCHC: 34.4 g/dL (ref 30.0–36.0)
MCV: 87.7 fL (ref 80.0–100.0)
Platelets: 334 10*3/uL (ref 150–400)
RBC: 5.27 MIL/uL (ref 4.22–5.81)
RDW: 12.2 % (ref 11.5–15.5)
WBC: 9 10*3/uL (ref 4.0–10.5)
nRBC: 0 % (ref 0.0–0.2)

## 2022-05-21 LAB — URINALYSIS, ROUTINE W REFLEX MICROSCOPIC
Bilirubin Urine: NEGATIVE
Glucose, UA: NEGATIVE mg/dL
Ketones, ur: NEGATIVE mg/dL
Nitrite: NEGATIVE
Protein, ur: 100 mg/dL — AB
RBC / HPF: >50 RBC/hpf — ABNORMAL HIGH (ref 0–5)
Specific Gravity, Urine: 1.031 — ABNORMAL HIGH (ref 1.005–1.030)
WBC, UA: >50 WBC/hpf — ABNORMAL HIGH (ref 0–5)
pH: 5.5 (ref 5.0–8.0)

## 2022-05-21 LAB — COMPREHENSIVE METABOLIC PANEL
ALT: 10 U/L (ref 0–44)
AST: 11 U/L — ABNORMAL LOW (ref 15–41)
Albumin: 4.3 g/dL (ref 3.5–5.0)
Alkaline Phosphatase: 59 U/L (ref 38–126)
Anion gap: 8 (ref 5–15)
BUN: 18 mg/dL (ref 6–20)
CO2: 29 mmol/L (ref 22–32)
Calcium: 10 mg/dL (ref 8.9–10.3)
Chloride: 100 mmol/L (ref 98–111)
Creatinine, Ser: 1.56 mg/dL — ABNORMAL HIGH (ref 0.61–1.24)
GFR, Estimated: 54 mL/min — ABNORMAL LOW (ref >60)
Glucose, Bld: 104 mg/dL — ABNORMAL HIGH (ref 70–99)
Potassium: 4 mmol/L (ref 3.5–5.1)
Sodium: 137 mmol/L (ref 135–145)
Total Bilirubin: 0.5 mg/dL (ref 0.3–1.2)
Total Protein: 8.3 g/dL — ABNORMAL HIGH (ref 6.5–8.1)

## 2022-05-21 LAB — LIPASE, BLOOD: Lipase: 33 U/L (ref 11–51)

## 2022-05-21 MED ORDER — SODIUM CHLORIDE 0.9 % IV SOLN
1.0000 g | Freq: Once | INTRAVENOUS | Status: AC
  Administered 2022-05-21: 1 g via INTRAVENOUS
  Filled 2022-05-21: qty 10

## 2022-05-21 MED ORDER — SODIUM CHLORIDE 0.9 % IV BOLUS
1000.0000 mL | Freq: Once | INTRAVENOUS | Status: AC
  Administered 2022-05-21: 1000 mL via INTRAVENOUS

## 2022-05-21 MED ORDER — CEFPODOXIME PROXETIL 200 MG PO TABS: 200.0000 mg | ORAL_TABLET | Freq: Two times a day (BID) | ORAL | 0 refills | Status: DC

## 2022-05-21 MED ORDER — IOHEXOL 300 MG/ML  SOLN
100.0000 mL | Freq: Once | INTRAMUSCULAR | Status: AC | PRN
  Administered 2022-05-21: 100 mL via INTRAVENOUS

## 2022-05-21 NOTE — Discharge Instructions (Addendum)
Please go to the [facility] for admission. You will need to check in with admissions to be escorted to your room.

## 2022-05-21 NOTE — ED Provider Notes (Signed)
[facility] EMERGENCY DEPT Provider Note   CSN: [id number] Arrival date & time: 05/21/22  1504     History  Chief Complaint  Patient presents with   Hematuria   Abdominal Pain    Charles Farmer is a 51 y.o. 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  Also reports dysuria, increased frequency, and urinary hesitancy.  States that he has been having urine that looks like fruit punch.  No clots though.  Dysuria has been going on for last 2 weeks, denies any flank pain.  No fevers or chills.     Home Medications Prior to Admission medications   Medication Sig Start Date End Date Taking? Authorizing Provider  erythromycin ophthalmic ointment Place a 1/2 inch ribbon of ointment into the lower eyelid twice daily until resolved 04/11/20   Particia Nearing, PA-C  furosemide (LASIX) 20 MG tablet Take 20 mg by mouth. 01/16/20   [provider]  metoprolol succinate (TOPROL-XL) 50 MG 24 hr tablet Take 50 mg by mouth. 01/16/20   [provider]      Allergies    Codeine    Review of Systems   Review of Systems  Constitutional:  Negative for chills and fever.  Gastrointestinal:  Positive for abdominal pain.  Genitourinary:  Positive for hematuria.    Physical Exam Updated Vital Signs BP (!) 134/105   Pulse 76   Temp 98.1 F (36.7 C)   Resp 16   Ht 5\' 8"  (1.727 m)   Wt 131.5 kg   SpO2 99%   BMI 44.08 kg/m  Physical Exam Vitals and nursing note reviewed.  Constitutional:      General: He is not in acute distress.    Appearance: He is well-developed.  HENT:     Head: Normocephalic and atraumatic.  Eyes:     Conjunctiva/sclera: Conjunctivae normal.  Cardiovascular:     Rate and Rhythm: Normal rate and regular rhythm.     Heart sounds: No murmur heard. Pulmonary:     Effort: Pulmonary effort is normal. No respiratory distress.      Breath sounds: Normal breath sounds.  Abdominal:     Palpations: Abdomen is soft.     Tenderness: There is abdominal tenderness in the suprapubic area. There is no right CVA tenderness, left CVA tenderness, guarding or rebound.  Musculoskeletal:        General: No swelling.     Cervical back: Neck supple.  Skin:    General: Skin is warm and dry.     Capillary Refill: Capillary refill takes less than 2 seconds.  Neurological:     Mental Status: He is alert.  Psychiatric:        Mood and Affect: Mood normal.     ED Results / Procedures / Treatments   Labs (all labs ordered are listed, but only abnormal results are displayed) Labs Reviewed  COMPREHENSIVE METABOLIC PANEL - Abnormal; Notable for the following components:      Result Value   Glucose, Bld 104 (*)    Creatinine, Ser 1.56 (*)    Total Protein 8.3 (*)    AST 11 (*)    GFR, Estimated 54 (*)    All other components within normal limits  URINALYSIS, ROUTINE W REFLEX MICROSCOPIC - Abnormal; Notable for the following components:   APPearance HAZY (*)    Specific Gravity, Urine 1.031 (*)  Hgb urine dipstick LARGE (*)    Protein, ur 100 (*)    Leukocytes,Ua MODERATE (*)    RBC / HPF >50 (*)    WBC, UA >50 (*)    All other components within normal limits  URINE CULTURE  LIPASE, BLOOD  CBC    EKG None  Radiology CT ABDOMEN PELVIS W CONTRAST  Result Date: 05/21/2022 CLINICAL DATA:  Hematuria, dysuria EXAM: CT ABDOMEN AND PELVIS WITH CONTRAST TECHNIQUE: Multidetector CT imaging of the abdomen and pelvis was performed using the standard protocol following bolus administration of intravenous contrast. RADIATION DOSE REDUCTION: This exam was performed according to the departmental dose-optimization program which includes automated exposure control, adjustment of the mA and/or kV according to patient size and/or use of iterative reconstruction technique. CONTRAST:  OMNIPAQUE IOHEXOL 300 MG/ML  SOLN COMPARISON:  None  Available. FINDINGS: Lower chest: Lung bases are clear. Hepatobiliary: Liver is within normal limits. Status post cholecystectomy. No intrahepatic ductal dilatation. Common duct is dilated centrally, measuring 17 mm, although smoothly tapering at the ampulla, likely postsurgical. Pancreas: Within normal limits. Spleen: Within normal limits. Adrenals/Urinary Tract: Adrenal glands are within normal limits. Kidneys are within normal limits.  No hydronephrosis. Bladder is underdistended but unremarkable. Stomach/Bowel: Stomach is within normal limits. Status post right hemicolectomy with suture line in the right mid abdomen (series 2/image 40). Prior appendectomy. No evidence of bowel obstruction. No colonic wall thickening or inflammatory changes. Vascular/Lymphatic: No evidence of abdominal aortic aneurysm. No suspicious abdominopelvic lymphadenopathy. Reproductive: Prostatomegaly, with enlargement of the central gland which indents the base of the bladder, suggesting BPH. Other: No abdominopelvic ascites. Musculoskeletal: Visualized osseous structures are within normal limits. IMPRESSION: No CT findings to account for the patient's hematuria. Prostatomegaly, suggesting BPH. Status post cholecystectomy, appendectomy, and right hemicolectomy. Electronically Signed   By: Charline Bills M.D.   On: 05/21/2022 18:49    Procedures Procedures    Medications Ordered in ED Medications  sodium chloride 0.9 % bolus 1,000 mL (0 mLs Intravenous Stopped 05/21/22 2101)  iohexol (OMNIPAQUE) 300 MG/ML solution 100 mL (100 mLs Intravenous Contrast Given 05/21/22 1819)  cefTRIAXone (ROCEPHIN) 1 g in sodium chloride 0.9 % 100 mL IVPB (0 g Intravenous Stopped 05/21/22 2102)    ED Course/ Medical Decision Making/ A&P                           Medical Decision Making Patient is a 51 year old male, here with complaint of hematuria.  History of hemophilia, factor VIII.  Will obtain CT abdomen pelvis to evaluate for etiology  of bleeding including mass in bladder, kidney stone.  IV fluids for further hydration.  CBC, CMP, UA ordered.  Amount and/or Complexity of Data Reviewed Labs: ordered.    Details: Urinalysis shows leukocytosis, white blood cells, and RBCs. Radiology: ordered.    Details: CT abdomen pelvis shows no etiology for hematuria.  No evidence of kidney stone, bladder mass. Discussion of management or test interpretation with external provider(s): Abdomen extensive conversation, with oncology on-call, they declined to provide any recommendations, recommend calling patient's oncologist/hematologist.  Patient follows with [facility] for hematology, they were consulted, Dr. Hyacinth Meeker, recommended admission for possible factor VIII.  Admitted to Dr. Hyacinth Meeker, patient will drive by private vehicle.  Will need to treat for UTI at [facility] along with monitoring of factor VIII.  Risk Prescription drug management.    Final Clinical Impression(s) / ED Diagnoses Final diagnoses:  Hematuria, unspecified type  Enlarged prostate  Urinary tract infection with hematuria, site unspecified    Rx / DC Orders ED Discharge Orders          Ordered    cefpodoxime (VANTIN) 200 MG tablet  2 times daily,   Status:  Discontinued        05/21/22 1922    cefpodoxime (VANTIN) 200 MG tablet  2 times daily,   Status:  Discontinued        05/21/22 1924              Gayanne Prescott, Si Gaul, PA 05/21/22 2137    Hayden Rasmussen, MD 05/22/22 [phone]

## 2022-05-21 NOTE — ED Notes (Signed)
Called [phone] and gave report to RN Lauren at [facility]. Per Ander Purpura, pt needs to go to [address] and follow walkway to the cancer center. On arrival, ask for guidance to Admissions for direct admit to room # 732. Wrap IV to protect site while pt is en route.

## 2022-05-21 NOTE — ED Triage Notes (Signed)
Patient arrives ambulatory to triage with complaints if blood urine x1 day and lower abdominal pain.   Patient reports burning and pain only with urination.

## 2022-05-22 NOTE — ED Notes (Signed)
Received a call from [facility] stating that patient had not yet arrived. Called patient to investigate. 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 Inquired about how he showered with an IV in place (asked this based on chart reflecting him having an IV). Pt responds that his IV was removed prior to departure from [facility] as it was painful to him. He states that he is on his way to [facility] now. Shared his intention to appear with [facility]; they respond that they will continue to hold the bed for this patient.

## 2022-05-23 LAB — URINE CULTURE: Culture: <10000 — AB

## 2022-05-28 ENCOUNTER — Emergency Department (HOSPITAL_COMMUNITY)
Admission: EM | Admit: 2022-05-28 | Discharge: 2022-05-29 | Disposition: A | Discharge status: Other Acute Inpatient Hospital | Payer: Commercial Managed Care - HMO | Attending: Emergency Medicine | Admitting: Emergency Medicine

## 2022-05-28 ENCOUNTER — Other Ambulatory Visit: Payer: Self-pay

## 2022-05-28 ENCOUNTER — Encounter (HOSPITAL_COMMUNITY): Payer: Self-pay

## 2022-05-28 ENCOUNTER — Emergency Department (HOSPITAL_COMMUNITY)
Admission: EM | Admit: 2022-05-28 | Discharge: 2022-05-28 | Discharge status: Against Medical Advice | Payer: PRIVATE HEALTH INSURANCE | Source: Home / Self Care

## 2022-05-28 DIAGNOSIS — R319 Hematuria, unspecified: Secondary | ICD-10-CM | POA: Diagnosis present

## 2022-05-28 LAB — CBC WITH DIFFERENTIAL/PLATELET
Abs Immature Granulocytes: 0.03 10*3/uL (ref 0.00–0.07)
Basophils Absolute: 0 10*3/uL (ref 0.0–0.1)
Basophils Relative: 1 %
Eosinophils Absolute: 0.2 10*3/uL (ref 0.0–0.5)
Eosinophils Relative: 3 %
HCT: 43.6 % (ref 39.0–52.0)
Hemoglobin: 15.1 g/dL (ref 13.0–17.0)
Immature Granulocytes: 0 %
Lymphocytes Relative: 43 %
Lymphs Abs: 3.2 10*3/uL (ref 0.7–4.0)
MCH: 30.4 pg (ref 26.0–34.0)
MCHC: 34.6 g/dL (ref 30.0–36.0)
MCV: 87.9 fL (ref 80.0–100.0)
Monocytes Absolute: 0.8 10*3/uL (ref 0.1–1.0)
Monocytes Relative: 10 %
Neutro Abs: 3.2 10*3/uL (ref 1.7–7.7)
Neutrophils Relative %: 43 %
Platelets: 273 10*3/uL (ref 150–400)
RBC: 4.96 MIL/uL (ref 4.22–5.81)
RDW: 12.1 % (ref 11.5–15.5)
WBC: 7.5 10*3/uL (ref 4.0–10.5)
nRBC: 0 % (ref 0.0–0.2)

## 2022-05-28 LAB — BASIC METABOLIC PANEL
Anion gap: 8 (ref 5–15)
BUN: 16 mg/dL (ref 6–20)
CO2: 27 mmol/L (ref 22–32)
Calcium: 9.3 mg/dL (ref 8.9–10.3)
Chloride: 103 mmol/L (ref 98–111)
Creatinine, Ser: 1.42 mg/dL — ABNORMAL HIGH (ref 0.61–1.24)
GFR, Estimated: >60 mL/min (ref >60)
Glucose, Bld: 99 mg/dL (ref 70–99)
Potassium: 3.7 mmol/L (ref 3.5–5.1)
Sodium: 138 mmol/L (ref 135–145)

## 2022-05-28 MED ORDER — LIDOCAINE HCL URETHRAL/MUCOSAL 2 % EX GEL
1.0000 | Freq: Once | CUTANEOUS | Status: AC
  Administered 2022-05-28: 1 via TOPICAL
  Filled 2022-05-28: qty 11

## 2022-05-28 MED ORDER — ANTIHEMOPHILIC FACTOR-VWF 250-600 UNITS IV SOLR: 40.0000 [IU]/kg | Freq: Once | INTRAVENOUS | Status: DC

## 2022-05-28 MED ORDER — ANTIHEM FACTOR RECOMB (RFVIII) 500 UNITS IV KIT
4348.0000 [IU] | PACK | Freq: Once | INTRAVENOUS | Status: AC
  Administered 2022-05-28: 4348 [IU] via INTRAVENOUS
  Filled 2022-05-28: qty 4348

## 2022-05-28 NOTE — ED Provider Triage Note (Cosign Needed Addendum)
Emergency Medicine Provider Triage Evaluation Note  Charles Farmer , a 51 y.o. male  was evaluated in triage.  Pt complains of urinating blood x1 day. States he was seen last week for the same thing, bled for 5 days before he went to [facility] (as he was initially instructed to), received Factor 8, stopped bleeding on Wednesday (when he received it), and then started bleeding again..  Review of Systems  Positive: hematuria Negative: melena  Physical Exam  There were no vitals taken for this visit. Gen:   Awake, no distress   Resp:  Normal effort  MSK:   Moves extremities without difficulty    Medical Decision Making  Medically screening exam initiated at 9:17 PM.  Appropriate orders placed.  Charles Farmer was informed that the remainder of the evaluation will be completed by another provider, this initial triage assessment does not replace that evaluation, and the importance of remaining in the ED until their evaluation is complete.  Will attempt to call [facility] and discuss case with hematology on call.    Charles Farmer, [place] 05/28/22 2119  Spoke with Dr. Trinna Farmer, hematology at [facility], he recommends obtaining PTT. Giving factor 8 4000-5000 units, ideally 4500 units and placing foley 2/2 to patient having difficulty urinating. Discussed transfer--he will call back with information as if they have beds or not. Appreciate his help.   Charles Pelt, PA 05/28/22 2251    Charles Farmer, [place] 05/28/22 2257

## 2022-05-28 NOTE — ED Triage Notes (Signed)
Complaining of blood in urine. Has hx of factor 8 deficiency. Now is passing huge clots.

## 2022-05-28 NOTE — ED Provider Notes (Signed)
[facility]-EMERGENCY DEPT Provider Note   CSN: [id number] Arrival date & time: 05/28/22  2006     History  Chief Complaint  Patient presents with   Hematuria    Charles Farmer is a 51 y.o. male.  51 y/o male with hx of hemophilia A and Factor 8 deficiency presents to the ED for hematuria x 1 day. Had similar symptoms last week which stopped after receiving IV factor 8 at OSH on Wednesday. Symptoms resumed today. States that hematuria has been worse as urine has been darker. He has had trouble voiding, passing clots with decreased urinary stream. No fevers, melena, syncope.  The history is provided by the patient and medical records. No language interpreter was used.  Hematuria       Home Medications Prior to Admission medications   Medication Sig Start Date End Date Taking? Authorizing Provider  erythromycin ophthalmic ointment Place a 1/2 inch ribbon of ointment into the lower eyelid twice daily until resolved 04/11/20   Particia Nearing, PA-C  furosemide (LASIX) 20 MG tablet Take 20 mg by mouth. 01/16/20   [provider]  metoprolol succinate (TOPROL-XL) 50 MG 24 hr tablet Take 50 mg by mouth. 01/16/20   [provider]      Allergies    Codeine    Review of Systems   Review of Systems  Genitourinary:  Positive for hematuria.  Ten systems reviewed and are negative for acute change, except as noted in the HPI.    Physical Exam Updated Vital Signs BP 133/87 (BP Location: Right Arm)   Pulse 74   Temp 98.2 F (36.8 C) (Oral)   Resp 18   Ht 5\' 8"  (1.727 m)   Wt 132.5 kg   SpO2 99%   BMI 44.40 kg/m   Physical Exam Vitals and nursing note reviewed.  Constitutional:      General: He is not in acute distress.    Appearance: He is well-developed. He is not diaphoretic.     Comments: Appears uncomfortable. Nontoxic appearing AA male.  HENT:     Head: Normocephalic and atraumatic.  Eyes:     General: No scleral icterus.     Conjunctiva/sclera: Conjunctivae normal.  Pulmonary:     Effort: Pulmonary effort is normal. No respiratory distress.     Comments: Respirations even and unlabored Genitourinary:    Comments: Foley catheter placed by RN. Tube and bag filled with thick, deep red hematuria. No clots visualized. Musculoskeletal:        General: Normal range of motion.     Cervical back: Normal range of motion.  Skin:    General: Skin is warm and dry.     Coloration: Skin is not pale.     Findings: No erythema or rash.  Neurological:     Mental Status: He is alert and oriented to person, place, and time.  Psychiatric:        Behavior: Behavior normal.     ED Results / Procedures / Treatments   Labs (all labs ordered are listed, but only abnormal results are displayed) Labs Reviewed  CBC WITH DIFFERENTIAL/PLATELET  BASIC METABOLIC PANEL  PROTIME-INR  APTT  TYPE AND SCREEN    EKG None  Radiology No results found.  Procedures Procedures    Medications Ordered in ED Medications  antihemophilic factor VIII (recomb) (KOGENATE) injection 4,348 Units (has no administration in time range)  lidocaine (XYLOCAINE) 2 % jelly 1 Application (1 Application Topical Given 05/28/22 2311)  ED Course/ Medical Decision Making/ A&P Clinical Course as of 05/28/22 2322  Sat May 28, 2022  2302 Small PA-C has spoke with [facility] hematology, Dr. Jolyne Loa, who has accepted the patient for admission. Will transfer via transport. Hematology requested IV Factor 8 be given. This has been ordered. [KH]    Clinical Course User Index [KH] Antony Madura, PA-C                           Medical Decision Making Amount and/or Complexity of Data Reviewed Labs: ordered.  Risk Prescription drug management.   This patient presents to the ED for concern of hematuria, this involves an extensive number of treatment options, and is a complaint that carries with it a high risk of complications and morbidity.  The differential  diagnosis includes cystitis vs kidney stone vs bladder CA   Co morbidities that complicate the patient evaluation  Hemophilia Factor 8 deficiency   Additional history obtained:  External records from outside source obtained and reviewed including CT abdomen/pelvis from 05/20/22 which was notable only for prostatomegaly    Lab Tests:  I Ordered, and personally interpreted labs.  The pertinent results include:  stable Hgb of 15.1   Cardiac Monitoring:  The patient was maintained on a cardiac monitor.  I personally viewed and interpreted the cardiac monitored which showed an underlying rhythm of: NSR   Medicines ordered and prescription drug management:  I ordered medication including IV Kogenate for Factor 8 repletion  Reevaluation of the patient after these medicines showed that the patient  remained stable I have reviewed the patients home medicines and have made adjustments as needed   Test Considered:  UA   Critical Interventions:  Insertion of 3 way foley catheter IV Factor 8 replacement   Consultations Obtained:  Small, PA-C discussed case with hematology service at [facility] who recommend admission for ongoing management.   Reevaluation:  After the interventions noted above, I reevaluated the patient and found that they have :stayed the same   Social Determinants of Health:  Patient reports being uninsured    Dispostion:  After consideration of the diagnostic results and the patients response to treatment, plan for admission to [facility]. Will transport via ambulance to ensure arrival at their facility.          Final Clinical Impression(s) / ED Diagnoses Final diagnoses:  Hematuria, unspecified type    Rx / DC Orders ED Discharge Orders     None         Antony Madura, PA-C 05/28/22 2322    Charlynne Pander, MD 05/29/22 [phone]

## 2022-05-29 DIAGNOSIS — R319 Hematuria, unspecified: Secondary | ICD-10-CM | POA: Diagnosis present

## 2022-05-29 LAB — TYPE AND SCREEN
ABO/RH(D): O POS
Antibody Screen: NEGATIVE

## 2022-07-13 ENCOUNTER — Emergency Department (HOSPITAL_BASED_OUTPATIENT_CLINIC_OR_DEPARTMENT_OTHER): Payer: Commercial Managed Care - HMO

## 2022-07-13 ENCOUNTER — Other Ambulatory Visit: Payer: Self-pay

## 2022-07-13 ENCOUNTER — Encounter (HOSPITAL_BASED_OUTPATIENT_CLINIC_OR_DEPARTMENT_OTHER): Payer: Self-pay

## 2022-07-13 ENCOUNTER — Emergency Department (HOSPITAL_BASED_OUTPATIENT_CLINIC_OR_DEPARTMENT_OTHER)
Admission: EM | Admit: 2022-07-13 | Discharge: 2022-07-13 | Disposition: A | Discharge status: Home / Self Care | Payer: Commercial Managed Care - HMO | Attending: Emergency Medicine | Admitting: Emergency Medicine

## 2022-07-13 DIAGNOSIS — I10 Essential (primary) hypertension: Secondary | ICD-10-CM | POA: Insufficient documentation

## 2022-07-13 DIAGNOSIS — N433 Hydrocele, unspecified: Secondary | ICD-10-CM | POA: Insufficient documentation

## 2022-07-13 DIAGNOSIS — Z79899 Other long term (current) drug therapy: Secondary | ICD-10-CM | POA: Diagnosis not present

## 2022-07-13 DIAGNOSIS — I861 Scrotal varices: Secondary | ICD-10-CM | POA: Insufficient documentation

## 2022-07-13 DIAGNOSIS — N453 Epididymo-orchitis: Secondary | ICD-10-CM | POA: Diagnosis not present

## 2022-07-13 DIAGNOSIS — R1909 Other intra-abdominal and pelvic swelling, mass and lump: Secondary | ICD-10-CM | POA: Diagnosis present

## 2022-07-13 LAB — URINALYSIS, ROUTINE W REFLEX MICROSCOPIC
Bacteria, UA: NONE SEEN
Bilirubin Urine: NEGATIVE
Glucose, UA: NEGATIVE mg/dL
Hgb urine dipstick: NEGATIVE
Ketones, ur: NEGATIVE mg/dL
Nitrite: NEGATIVE
Protein, ur: NEGATIVE mg/dL
Specific Gravity, Urine: 1.021 (ref 1.005–1.030)
pH: 7 (ref 5.0–8.0)

## 2022-07-13 MED ORDER — LEVOFLOXACIN 500 MG PO TABS: 500.0000 mg | ORAL_TABLET | Freq: Every day | ORAL | 0 refills | Status: DC

## 2022-07-13 NOTE — ED Triage Notes (Signed)
Patient here POV from Home.  Endorses Left Scrotum Swelling that began. Unsure of when it began. States now it is the Size of a Grapefruit.   No Dysuria. No Hematuria. No Fevers. No Trauma  NAD Noted during Triage. A&Ox4. GCS 15.

## 2022-07-13 NOTE — Discharge Instructions (Signed)
Please take your medications as prescribed. Take tylenol/ibuprofen for pain. I recommend close follow-up with PCP for reevaluation.  Please do not hesitate to return to emergency department if worrisome signs symptoms we discussed become apparent.  

## 2022-07-13 NOTE — ED Notes (Signed)
Pt called for room x 1, no answer.  

## 2022-07-13 NOTE — ED Notes (Signed)
Pt called for vitals x2, no answer 

## 2022-07-13 NOTE — ED Provider Notes (Signed)
[facility] EMERGENCY DEPT Provider Note   CSN: [id number] Arrival date & time: 07/13/22  1147     History  Chief Complaint  Patient presents with   Groin Swelling    Charles Farmer is a 51 y.o. male with a past medical history of hypertension presenting to the emergency department for evaluation of testicular pain.  Patient reports that he has had increased swelling and pain in his left testicle in the last 3 to 4 days.  He reports some mild suprapubic abdominal pain.  He denies any urinary symptoms.  No blood in his stool or urine.  No nausea or vomiting.  Denies any trauma to his testicle.  HPI    Past Medical History:  Diagnosis Date   Hypertension    History reviewed. No pertinent surgical history.   Home Medications Prior to Admission medications   Medication Sig Start Date End Date Taking? Authorizing Provider  levofloxacin (LEVAQUIN) 500 MG tablet Take 1 tablet (500 mg total) by mouth daily. 07/13/22  Yes Jeanelle Malling, PA  erythromycin ophthalmic ointment Place a 1/2 inch ribbon of ointment into the lower eyelid twice daily until resolved 04/11/20   Particia Nearing, PA-C  furosemide (LASIX) 20 MG tablet Take 20 mg by mouth. 01/16/20   [provider]  metoprolol succinate (TOPROL-XL) 50 MG 24 hr tablet Take 50 mg by mouth. 01/16/20   [provider]      Allergies    Codeine    Review of Systems   Review of Systems Negative except as per HPI.  Physical Exam Updated Vital Signs BP 112/79   Pulse 84   Temp 98.8 F (37.1 C) (Oral)   Resp 18   Ht 5\' 8"  (1.727 m)   Wt 132.5 kg   SpO2 100%   BMI 44.42 kg/m  Physical Exam Vitals and nursing note reviewed.  Constitutional:      Appearance: Normal appearance.  HENT:     Head: Normocephalic and atraumatic.     Mouth/Throat:     Mouth: Mucous membranes are moist.  Eyes:     General: No scleral icterus. Cardiovascular:     Rate and Rhythm: Normal rate and regular rhythm.      Pulses: Normal pulses.     Heart sounds: Normal heart sounds.  Pulmonary:     Effort: Pulmonary effort is normal.     Breath sounds: Normal breath sounds.  Abdominal:     General: Abdomen is flat.     Palpations: Abdomen is soft.     Tenderness: There is no abdominal tenderness.  Genitourinary:    Comments: [name], RN stayed in the room as chaperone.  Swollen left testicle noted.  Patient reports his left testicle is always bigger than right.  No overlying skin erythema. Musculoskeletal:        General: No deformity.  Skin:    General: Skin is warm.     Findings: No rash.  Neurological:     General: No focal deficit present.     Mental Status: He is alert.  Psychiatric:        Mood and Affect: Mood normal.     ED Results / Procedures / Treatments   Labs (all labs ordered are listed, but only abnormal results are displayed) Labs Reviewed  URINALYSIS, ROUTINE W REFLEX MICROSCOPIC - Abnormal; Notable for the following components:      Result Value   Leukocytes,Ua SMALL (*)    All other components within normal limits  EKG None  Radiology US SCROTUM W/DOPPLER  Result Date: 07/13/2022 CLINICAL DATA:  Left scrotal pain and swelling EXAM: SCROTAL ULTRASOUND DOPPLER ULTRASOUND OF THE TESTICLES TECHNIQUE: Complete ultrasound examination of the testicles, epididymis, and other scrotal structures was performed. Color and spectral Doppler ultrasound were also utilized to evaluate blood flow to the testicles. COMPARISON:  None Available. FINDINGS: Right testicle Measurements: 3.2 x 1.4 x 2.8 cm. There is slightly inhomogeneous echogenicity. There is 4 mm cyst. Left testicle Measurements: 4.6 x 3.1 x 3.8 cm. There is 7 mm cyst. There is increased vascular flow. Right epididymis:  There is 3 mm cyst. Left epididymis:  Enlarged with increased vascular flow. Hydrocele:  Small to moderate left hydrocele. Varicocele:  Left varicocele. Pulsed Doppler interrogation of both testes demonstrates  normal low resistance arterial and venous waveforms bilaterally. IMPRESSION: There is no evidence of testicular torsion. Left ovary is larger than right. There is increased vascular flow in the left testes and left epididymis suggesting acute left epididymo-orchitis. There is small to moderate left hydrocele. Left varicocele. There are few cysts in both ovaries and right epididymis. Electronically Signed   By: Ernie Avena M.D.   On: 07/13/2022 13:47    Procedures Procedures    Medications Ordered in ED Medications - No data to display  ED Course/ Medical Decision Making/ A&P                           Medical Decision Making Risk Prescription drug management.   This patient presents to the ED for L testicular pain, this involves an extensive number of treatment options, and is a complaint that carries with a high risk of complications and morbidity.  The differential diagnosis includes epididymitis, orchitis, testicular torsion, varicose, hydrocele, infectious etiology, Fournier's gangrene.  This is not an exhaustive list.  Lab tests:  Imaging studies: I ordered imaging studies. I personally reviewed, interpreted imaging and agree with the radiologist's interpretations. The results include: Ultrasound show evidence of acute left epididymo orchitis and hydrocele with cysts in both testicles and right epididymis.  Problem list/ ED course/ Critical interventions/ Medical management: HPI: See above Vital signs within normal range and stable throughout visit. Laboratory/imaging studies significant for: See above. On physical examination, patient is afebrile and appears in no acute distress. The patient is suffering from testicular pain, but based on the history, exam, and work up, I do not suspect that the patient has testicular torsion, abscess, severe cellulitis, Fournier's gangrene, inguinal hernia or other emergent cause. Based on patient's clinical presentations and  laboratory/imaging studies I suspect acute left epididymo orchitis and hydrocele with cysts in both testicles and right epididymis.  I will send an Rx of levofloxacin.  Advised patient to follow-up with urology for further evaluation and management.  Return to the ER if new or worsening symptoms.  I have reviewed the patient home medicines and have made adjustments as needed.  Cardiac monitoring/EKG: The patient was maintained on a cardiac monitor.  I personally reviewed and interpreted the cardiac monitor which showed an underlying rhythm of: sinus rhythm.  Additional history obtained: External records from outside source obtained and reviewed including: Chart review including previous notes, labs, imaging.  Consultations obtained:  Disposition Continued outpatient therapy. Follow-up with urology recommended for reevaluation of symptoms. Treatment plan discussed with patient.  Pt acknowledged understanding was agreeable to the plan. Worrisome signs and symptoms were discussed with patient, and patient acknowledged understanding to return to the  ED if they noticed these signs and symptoms. Patient was stable upon discharge.   This chart was dictated using voice recognition software.  Despite best efforts to proofread,  errors can occur which can change the documentation meaning.          Final Clinical Impression(s) / ED Diagnoses Final diagnoses:  Epididymo-orchitis  Hydrocele, unspecified hydrocele type  Varicocele    Rx / DC Orders ED Discharge Orders          Ordered    levofloxacin (LEVAQUIN) 500 MG tablet  Daily        07/13/22 2033              Jeanelle Malling, PA 07/13/22 2332    Jacalyn Lefevre, MD 07/20/22 [phone]

## 2023-01-31 DIAGNOSIS — I10 Essential (primary) hypertension: Secondary | ICD-10-CM | POA: Insufficient documentation

## 2023-01-31 DIAGNOSIS — M25561 Pain in right knee: Secondary | ICD-10-CM | POA: Insufficient documentation

## 2023-01-31 DIAGNOSIS — M25562 Pain in left knee: Secondary | ICD-10-CM | POA: Diagnosis not present

## 2023-01-31 DIAGNOSIS — Z79899 Other long term (current) drug therapy: Secondary | ICD-10-CM | POA: Insufficient documentation

## 2023-01-31 DIAGNOSIS — R2243 Localized swelling, mass and lump, lower limb, bilateral: Secondary | ICD-10-CM | POA: Insufficient documentation

## 2023-02-01 ENCOUNTER — Emergency Department (HOSPITAL_COMMUNITY)
Admission: EM | Admit: 2023-02-01 | Discharge: 2023-02-01 | Disposition: A | Discharge status: Home / Self Care | Payer: 59 | Attending: Emergency Medicine | Admitting: Emergency Medicine

## 2023-02-01 ENCOUNTER — Other Ambulatory Visit: Payer: Self-pay

## 2023-02-01 DIAGNOSIS — M25569 Pain in unspecified knee: Secondary | ICD-10-CM

## 2023-02-01 NOTE — ED Provider Notes (Signed)
[place] EMERGENCY DEPARTMENT AT [facility] Provider Note   CSN: [id number] Arrival date & time: 01/31/23  2355     History  Chief Complaint  Patient presents with   Joint Swelling    Charles Farmer is a 52 y.o. male.  HPI   Patient with medical history including factor VIII deficiency, hypertension, presenting with complaints of needing medication injection.  Patient states that having bilateral knee pain for last 2 days, states that this typically happens when he starts to bleed in his knees and he was told by his oncologist that he needs to be given his advate.  He has no other complaints, he does does not have any chest pain shortness of breath, no pain in the upper extremities, he denies any recent falls, no stomach pain no nausea or vomiting.  Patient states that he is here because his family is not home to help him give his medicine.  He states that he is going to see his hematologist tomorrow.    Home Medications Prior to Admission medications   Medication Sig Start Date End Date Taking? Authorizing Provider  Antihemophil Factor, rAHF-PFM, (ADVATE) 4000 units SOLR Inject 1 application  into the vein once as needed.   Yes [provider]  finasteride (PROSCAR) 5 MG tablet Take 5 mg by mouth daily. 05/31/22  Yes [provider]  glipiZIDE (GLUCOTROL XL) 5 MG 24 hr tablet Take 5 mg by mouth daily with breakfast.   Yes [provider]  lisinopril-hydrochlorothiazide (ZESTORETIC) 20-25 MG tablet Take 1 tablet by mouth daily. 04/07/22  Yes [provider]  metoprolol succinate (TOPROL-XL) 50 MG 24 hr tablet Take 50 mg by mouth. 01/16/20  Yes [provider]  tamsulosin (FLOMAX) 0.4 MG CAPS capsule Take 1 capsule by mouth daily. 05/26/22  Yes [provider]      Allergies    Codeine    Review of Systems   Review of Systems  Constitutional:  Negative for chills and fever.  Respiratory:  Negative for shortness of  breath.   Cardiovascular:  Negative for chest pain.  Gastrointestinal:  Negative for abdominal pain.  Neurological:  Negative for headaches.    Physical Exam Updated Vital Signs BP (!) 161/119 (BP Location: Left Arm)   Pulse 77   Temp 97.7 F (36.5 C) (Oral)   Resp 16   Ht 5\' 7"  (1.702 m)   Wt 129.3 kg   SpO2 97%   BMI 44.64 kg/m  Physical Exam Vitals and nursing note reviewed.  Constitutional:      General: He is not in acute distress.    Appearance: He is not ill-appearing.  HENT:     Head: Normocephalic and atraumatic.     Nose: No congestion.  Eyes:     Conjunctiva/sclera: Conjunctivae normal.  Cardiovascular:     Rate and Rhythm: Normal rate and regular rhythm.     Pulses: Normal pulses.     Heart sounds: No murmur heard.    No friction rub. No gallop.  Pulmonary:     Effort: No respiratory distress.     Breath sounds: No wheezing, rhonchi or rales.  Musculoskeletal:     Comments: Focus of the lower extremities was benign, there is no obvious bony abnormality, no overlying skin changes, he has full range of motion in his toes ankle knees bilaterally, all compartments are soft, no palpable cords or unilateral leg swelling, he has 2+ dorsal pedal pulses, 2-second capillary refill, sensation tact light  touch.  Skin:    General: Skin is warm and dry.  Neurological:     Mental Status: He is alert.  Psychiatric:        Mood and Affect: Mood normal.     ED Results / Procedures / Treatments   Labs (all labs ordered are listed, but only abnormal results are displayed) Labs Reviewed - No data to display  EKG None  Radiology No results found.  Procedures Procedures    Medications Ordered in ED Medications - No data to display  ED Course/ Medical Decision Making/ A&P                             Medical Decision Making  This patient presents to the ED for concern of knee swelling, this involves an extensive number of treatment options, and is a complaint  that carries with it a high risk of complications and morbidity.  The differential diagnosis includes Hemarthrosis, septic arthritis, fracture, DVT     Additional history obtained:  Additional history obtained from n/a External records from outside source obtained and reviewed including oncology   Co morbidities that complicate the patient evaluation  Factor VII deficiency  Social Determinants of Health:  N/A    Lab Tests:  I Ordered, and personally interpreted labs.  The pertinent results include: Patient refused   Imaging Studies ordered:  I ordered imaging studies including patient refused I independently visualized and interpreted imaging which showed patient refused I agree with the radiologist interpretation   Cardiac Monitoring:  The patient was maintained on a cardiac monitor.  I personally viewed and interpreted the cardiac monitored which showed an underlying rhythm of: N/A   Medicines ordered and prescription drug management:  I ordered medication including n/a I have reviewed the patients home medicines and have made adjustments as needed  Critical Interventions:  N/a   Reevaluation:  Patient presents needing hospital staff to give him advate, states that he came with it and like [place] to give it to him. I explained that I would like to obtain  imaging of his knees to ensure there is no underlying abnormalities i.e. bony abnormalities, malignancy as well lab work to ensure there is no contraindication to provide patient with his medication.  Patient refused this, and just wants his medication given to him, I did explain that per hospital protocol we can only get medication that we order from in-hospital pharmacy but the patient is refusing this as well.  He states that he would rather leave and get his oncologist in the morning.   Consultations Obtained:  N/a    Test Considered:  N/a    Rule out N/a    Dispostion and problem list  After  consideration of the diagnostic results and the patients response to treatment, I feel that the patent would benefit from ama.  Knee pain-unclear etiology, patient is leaving AMA, I encouraged him to come back if symptoms are worsening and/or follow-up with his oncologist for further evaluation.            Final Clinical Impression(s) / ED Diagnoses Final diagnoses:  Knee pain, unspecified chronicity, unspecified laterality    Rx / DC Orders ED Discharge Orders     None         Barnie Del 02/01/23 4010    Palumbo, April, MD 02/01/23 2725

## 2023-02-01 NOTE — ED Triage Notes (Signed)
Pt reports swelling and pain to both knees when walking. Pt has hx of hemophilia and spoke with hematologist who told pt to administer is factor. Pt reports he does not have anyone at home to give it to him so he is requesting we do. Pt also HTN in triage and reports he hasn't taken his BP medication due to not being able to afford it.

## 2023-07-10 ENCOUNTER — Encounter (HOSPITAL_BASED_OUTPATIENT_CLINIC_OR_DEPARTMENT_OTHER): Payer: Self-pay | Admitting: Emergency Medicine

## 2023-07-10 ENCOUNTER — Emergency Department (HOSPITAL_BASED_OUTPATIENT_CLINIC_OR_DEPARTMENT_OTHER)
Admission: EM | Admit: 2023-07-10 | Discharge: 2023-07-10 | Disposition: A | Discharge status: Home / Self Care | Payer: 59 | Attending: Emergency Medicine | Admitting: Emergency Medicine

## 2023-07-10 ENCOUNTER — Other Ambulatory Visit: Payer: Self-pay

## 2023-07-10 DIAGNOSIS — Z79899 Other long term (current) drug therapy: Secondary | ICD-10-CM | POA: Diagnosis not present

## 2023-07-10 DIAGNOSIS — Z7984 Long term (current) use of oral hypoglycemic drugs: Secondary | ICD-10-CM | POA: Insufficient documentation

## 2023-07-10 DIAGNOSIS — I10 Essential (primary) hypertension: Secondary | ICD-10-CM | POA: Insufficient documentation

## 2023-07-10 DIAGNOSIS — R739 Hyperglycemia, unspecified: Secondary | ICD-10-CM

## 2023-07-10 DIAGNOSIS — E1165 Type 2 diabetes mellitus with hyperglycemia: Secondary | ICD-10-CM | POA: Insufficient documentation

## 2023-07-10 HISTORY — DX: Hereditary factor VIII deficiency: D66

## 2023-07-10 HISTORY — DX: Type 2 diabetes mellitus without complications: E11.9

## 2023-07-10 LAB — CBC
HCT: 42 % (ref 39.0–52.0)
Hemoglobin: 15.2 g/dL (ref 13.0–17.0)
MCH: 30.1 pg (ref 26.0–34.0)
MCHC: 36.2 g/dL — ABNORMAL HIGH (ref 30.0–36.0)
MCV: 83.2 fL (ref 80.0–100.0)
Platelets: 240 10*3/uL (ref 150–400)
RBC: 5.05 MIL/uL (ref 4.22–5.81)
RDW: 12.4 % (ref 11.5–15.5)
WBC: 6.5 10*3/uL (ref 4.0–10.5)
nRBC: 0 % (ref 0.0–0.2)

## 2023-07-10 LAB — I-STAT VENOUS BLOOD GAS, ED
Acid-Base Excess: 0 mmol/L (ref 0.0–2.0)
Bicarbonate: 26.2 mmol/L (ref 20.0–28.0)
Calcium, Ion: 1.2 mmol/L (ref 1.15–1.40)
HCT: 44 % (ref 39.0–52.0)
Hemoglobin: 15 g/dL (ref 13.0–17.0)
O2 Saturation: 42 %
Potassium: 4.3 mmol/L (ref 3.5–5.1)
Sodium: 134 mmol/L — ABNORMAL LOW (ref 135–145)
TCO2: 28 mmol/L (ref 22–32)
pCO2, Ven: 49.4 mm[Hg] (ref 44–60)
pH, Ven: 7.332 (ref 7.25–7.43)
pO2, Ven: 26 mm[Hg] — CL (ref 32–45)

## 2023-07-10 LAB — COMPREHENSIVE METABOLIC PANEL
ALT: 14 U/L (ref 0–44)
AST: 11 U/L — ABNORMAL LOW (ref 15–41)
Albumin: 4.2 g/dL (ref 3.5–5.0)
Alkaline Phosphatase: 68 U/L (ref 38–126)
Anion gap: 9 (ref 5–15)
BUN: 19 mg/dL (ref 6–20)
CO2: 26 mmol/L (ref 22–32)
Calcium: 9.8 mg/dL (ref 8.9–10.3)
Chloride: 97 mmol/L — ABNORMAL LOW (ref 98–111)
Creatinine, Ser: 1.51 mg/dL — ABNORMAL HIGH (ref 0.61–1.24)
GFR, Estimated: 56 mL/min — ABNORMAL LOW (ref >60)
Glucose, Bld: 410 mg/dL — ABNORMAL HIGH (ref 70–99)
Potassium: 4 mmol/L (ref 3.5–5.1)
Sodium: 132 mmol/L — ABNORMAL LOW (ref 135–145)
Total Bilirubin: 1 mg/dL (ref <1.2)
Total Protein: 7.9 g/dL (ref 6.5–8.1)

## 2023-07-10 LAB — CBG MONITORING, ED
Glucose-Capillary: 400 mg/dL — ABNORMAL HIGH (ref 70–99)
Glucose-Capillary: 280 mg/dL — ABNORMAL HIGH (ref 70–99)

## 2023-07-10 MED ORDER — INSULIN ASPART 100 UNIT/ML IJ SOLN
5.0000 [IU] | Freq: Once | INTRAMUSCULAR | Status: AC
  Administered 2023-07-10: 5 [IU] via SUBCUTANEOUS

## 2023-07-10 MED ORDER — LACTATED RINGERS IV BOLUS
1000.0000 mL | Freq: Once | INTRAVENOUS | Status: AC
  Administered 2023-07-10: 1000 mL via INTRAVENOUS

## 2023-07-10 MED ORDER — INSULIN REGULAR HUMAN 100 UNIT/ML IJ SOLN: 5.0000 [IU] | Freq: Once | INTRAMUSCULAR | Status: DC

## 2023-07-10 NOTE — ED Triage Notes (Signed)
Pt c/o "high sugar". Endorses urinary frequency and "feeling tired". Endorses dysuria

## 2023-07-10 NOTE — ED Provider Notes (Signed)
[place] EMERGENCY DEPARTMENT AT [facility] Provider Note   CSN: [id number] Arrival date & time: 07/10/23  2778     History  Chief Complaint  Patient presents with   Hyperglycemia   HPI Charles Farmer is a 52 y.o. male with history of hypertension and type 2 diabetes presenting for hyperglycemia.  Reports that his "sugar has been high" in the last 3 to 4 days.  Also states he is "feeling tired", endorsing polyuria and polydipsia.  Denies nausea vomiting diarrhea.  Denies abdominal pain.  Denies shortness of breath and chest pain.  Denies fever.  Reports he takes glipizide and Jardiance for his diabetes and has been taking them as prescribed.   Hyperglycemia      Home Medications Prior to Admission medications   Medication Sig Start Date End Date Taking? Authorizing Provider  Antihemophil Factor, rAHF-PFM, (ADVATE) 4000 units SOLR Inject 1 application  into the vein once as needed.    [provider]  finasteride (PROSCAR) 5 MG tablet Take 5 mg by mouth daily. 05/31/22   [provider]  glipiZIDE (GLUCOTROL XL) 5 MG 24 hr tablet Take 5 mg by mouth daily with breakfast.    [provider]  lisinopril-hydrochlorothiazide (ZESTORETIC) 20-25 MG tablet Take 1 tablet by mouth daily. 04/07/22   [provider]  metoprolol succinate (TOPROL-XL) 50 MG 24 hr tablet Take 50 mg by mouth. 01/16/20   [provider]  tamsulosin (FLOMAX) 0.4 MG CAPS capsule Take 1 capsule by mouth daily. 05/26/22   [provider]      Allergies    Codeine    Review of Systems   See HPI  Physical Exam Updated Vital Signs BP (!) 118/98   Pulse 69   Temp 98 F (36.7 C)   Resp 18   SpO2 99%  Physical Exam Vitals and nursing note reviewed.  HENT:     Head: Normocephalic and atraumatic.     Mouth/Throat:     Mouth: Mucous membranes are moist.  Eyes:     General:        Right eye: No discharge.        Left eye: No discharge.      Conjunctiva/sclera: Conjunctivae normal.  Cardiovascular:     Rate and Rhythm: Normal rate and regular rhythm.     Pulses: Normal pulses.     Heart sounds: Normal heart sounds.  Pulmonary:     Effort: Pulmonary effort is normal.     Breath sounds: Normal breath sounds.  Abdominal:     General: Abdomen is flat.     Palpations: Abdomen is soft.  Skin:    General: Skin is warm and dry.  Neurological:     General: No focal deficit present.  Psychiatric:        Mood and Affect: Mood normal.     ED Results / Procedures / Treatments   Labs (all labs ordered are listed, but only abnormal results are displayed) Labs Reviewed  COMPREHENSIVE METABOLIC PANEL - Abnormal; Notable for the following components:      Result Value   Sodium 132 (*)    Chloride 97 (*)    Glucose, Bld 410 (*)    Creatinine, Ser 1.51 (*)    AST 11 (*)    GFR, Estimated 56 (*)    All other components within normal limits  CBC - Abnormal; Notable for the following components:   MCHC 36.2 (*)    All other components within normal  limits  CBG MONITORING, ED - Abnormal; Notable for the following components:   Glucose-Capillary 400 (*)    All other components within normal limits  I-STAT VENOUS BLOOD GAS, ED - Abnormal; Notable for the following components:   pO2, Ven 26 (*)    Sodium 134 (*)    All other components within normal limits  CBG MONITORING, ED - Abnormal; Notable for the following components:   Glucose-Capillary 280 (*)    All other components within normal limits    EKG None  Radiology No results found.  Procedures Procedures    Medications Ordered in ED Medications  lactated ringers bolus 1,000 mL (1,000 mLs Intravenous New Bag/Given 07/10/23 1104)  insulin aspart (novoLOG) injection 5 Units (5 Units Subcutaneous Given 07/10/23 1214)    ED Course/ Medical Decision Making/ A&P                                 Medical Decision Making Amount and/or Complexity of Data  Reviewed Labs: ordered.  Risk Prescription drug management.   Initial Impression and Ddx 52 year old well-appearing male presenting for hyperglycemia.  Initial CBG was 400.  DDx includes DKA, HHS, hyperglycemia, sepsis, electrolyte derangement, other. Patient PMH that increases complexity of ED encounter:  hypertension and type 2 diabetes  Interpretation of Diagnostics - I independent reviewed and interpreted the labs as followed: Initial blood sugar was 400.  After fluids and 5 units of NovoLog recheck was 280.  Reduced GFR    Patient Reassessment and Ultimate Disposition/Management On reassessment, blood sugar had improved after treatment.  Workup does not suggest DKA or HHS.  Also does not support active infection at this time.  Labs were largely unremarkable but did reveal he may have had a mild AKI.  Per chart review, appears that his diabetes is intermittently poorly controlled.  Recommend that he follow-up with his PCP and consider possibly initiation of insulin for management moving forward.  Discussed per return precautions.  Vital stable.  Discharged home in good condition.  Patient management required discussion with the following services or consulting groups:  None  Complexity of Problems Addressed Acute complicated illness or Injury  Additional Data Reviewed and Analyzed Further history obtained from: Past medical history and medications listed in the EMR and Prior ED visit notes  Patient Encounter Risk Assessment None         Final Clinical Impression(s) / ED Diagnoses Final diagnoses:  Hyperglycemia    Rx / DC Orders ED Discharge Orders     None         Gareth Eagle, PA-C 07/10/23 1405    Gwyneth Sprout, MD 07/10/23 [phone]

## 2023-07-10 NOTE — ED Notes (Signed)
Pt aox4.

## 2023-07-10 NOTE — Discharge Instructions (Signed)
Evaluation today revealed that you were hyperglycemic but he responded well to our treatment here with fluids and insulin.  Recommend that you continue taking your glipizide and Jardiance as prescribed and please follow-up with your PCP to discuss management of her diabetes within 4.  If you develop shortness of breath, hyperglycemia again at home, nausea vomiting diarrhea or abdominal pain or any other concerning symptom please return to the emergency department further evaluation.

## 2023-07-15 ENCOUNTER — Other Ambulatory Visit: Payer: Self-pay

## 2023-07-15 ENCOUNTER — Emergency Department (HOSPITAL_BASED_OUTPATIENT_CLINIC_OR_DEPARTMENT_OTHER): Payer: 59 | Admitting: Radiology

## 2023-07-15 ENCOUNTER — Emergency Department (HOSPITAL_BASED_OUTPATIENT_CLINIC_OR_DEPARTMENT_OTHER): Payer: 59

## 2023-07-15 ENCOUNTER — Encounter (HOSPITAL_BASED_OUTPATIENT_CLINIC_OR_DEPARTMENT_OTHER): Payer: Self-pay

## 2023-07-15 DIAGNOSIS — M79645 Pain in left finger(s): Secondary | ICD-10-CM | POA: Insufficient documentation

## 2023-07-15 DIAGNOSIS — Z7984 Long term (current) use of oral hypoglycemic drugs: Secondary | ICD-10-CM | POA: Diagnosis not present

## 2023-07-15 DIAGNOSIS — R519 Headache, unspecified: Secondary | ICD-10-CM | POA: Diagnosis present

## 2023-07-15 DIAGNOSIS — S0990XA Unspecified injury of head, initial encounter: Secondary | ICD-10-CM | POA: Insufficient documentation

## 2023-07-15 DIAGNOSIS — E1165 Type 2 diabetes mellitus with hyperglycemia: Secondary | ICD-10-CM | POA: Insufficient documentation

## 2023-07-15 DIAGNOSIS — Y92410 Unspecified street and highway as the place of occurrence of the external cause: Secondary | ICD-10-CM | POA: Insufficient documentation

## 2023-07-15 LAB — CBG MONITORING, ED: Glucose-Capillary: 475 mg/dL — ABNORMAL HIGH (ref 70–99)
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
## 2023-07-16 ENCOUNTER — Emergency Department (HOSPITAL_BASED_OUTPATIENT_CLINIC_OR_DEPARTMENT_OTHER)
Admission: EM | Admit: 2023-07-16 | Discharge: 2023-07-16 | Disposition: A | Discharge status: Home / Self Care | Payer: 59 | Attending: Emergency Medicine | Admitting: Emergency Medicine

## 2023-07-16 DIAGNOSIS — R739 Hyperglycemia, unspecified: Secondary | ICD-10-CM

## 2023-07-16 DIAGNOSIS — S0990XA Unspecified injury of head, initial encounter: Secondary | ICD-10-CM
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
## 2023-07-20 ENCOUNTER — Emergency Department (HOSPITAL_BASED_OUTPATIENT_CLINIC_OR_DEPARTMENT_OTHER): Payer: 59

## 2023-07-20 ENCOUNTER — Other Ambulatory Visit: Payer: Self-pay

## 2023-07-20 ENCOUNTER — Encounter (HOSPITAL_BASED_OUTPATIENT_CLINIC_OR_DEPARTMENT_OTHER): Payer: Self-pay | Admitting: Emergency Medicine

## 2023-07-20 ENCOUNTER — Emergency Department (HOSPITAL_BASED_OUTPATIENT_CLINIC_OR_DEPARTMENT_OTHER)
Admission: EM | Admit: 2023-07-20 | Discharge: 2023-07-20 | Disposition: A | Discharge status: Home / Self Care | Payer: 59 | Attending: Emergency Medicine | Admitting: Emergency Medicine

## 2023-07-20 DIAGNOSIS — Z7984 Long term (current) use of oral hypoglycemic drugs: Secondary | ICD-10-CM | POA: Insufficient documentation

## 2023-07-20 DIAGNOSIS — Z79899 Other long term (current) drug therapy: Secondary | ICD-10-CM | POA: Diagnosis not present

## 2023-07-20 DIAGNOSIS — R519 Headache, unspecified: Secondary | ICD-10-CM | POA: Insufficient documentation

## 2023-07-20 DIAGNOSIS — I10 Essential (primary) hypertension: Secondary | ICD-10-CM | POA: Diagnosis not present

## 2023-07-20 DIAGNOSIS — J45909 Unspecified asthma, uncomplicated: Secondary | ICD-10-CM | POA: Diagnosis not present

## 2023-07-20 DIAGNOSIS — E119 Type 2 diabetes mellitus without complications: Secondary | ICD-10-CM | POA: Insufficient documentation

## 2023-07-20 MED ORDER — METOCLOPRAMIDE HCL 5 MG/ML IJ SOLN
10.0000 mg | Freq: Once | INTRAMUSCULAR | Status: AC
  Administered 2023-07-20: 10 mg via INTRAVENOUS
  Filled 2023-07-20: qty 2

## 2023-07-20 MED ORDER — DIPHENHYDRAMINE HCL 50 MG/ML IJ SOLN
25.0000 mg | Freq: Once | INTRAMUSCULAR | Status: AC
  Administered 2023-07-20: 25 mg via INTRAVENOUS
  Filled 2023-07-20: qty 1
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
## 2023-07-20 NOTE — Discharge Instructions (Signed)
 As discussed, CT scan without any evidence of bleed.  Suspect you could have concussion.  Will recommend follow-up with concussion clinic in the outpatient setting as well as continued use of Tylenol  for any headache.  Please not hesitate to return if the worrisome signs and symptoms we discussed become apparent.
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
## 2023-07-26 ENCOUNTER — Encounter: Payer: 59 | Admitting: Family Medicine

## 2023-07-31 ENCOUNTER — Encounter: Payer: 59 | Admitting: Sports Medicine

## 2023-08-03 ENCOUNTER — Inpatient Hospital Stay (HOSPITAL_BASED_OUTPATIENT_CLINIC_OR_DEPARTMENT_OTHER)
Admission: EM | Admit: 2023-08-03 | Discharge: 2023-08-06 | DRG: 638 | Discharge status: Against Medical Advice | Payer: 59 | Attending: Internal Medicine | Admitting: Internal Medicine

## 2023-08-03 ENCOUNTER — Other Ambulatory Visit: Payer: Self-pay

## 2023-08-03 ENCOUNTER — Encounter (HOSPITAL_BASED_OUTPATIENT_CLINIC_OR_DEPARTMENT_OTHER): Payer: Self-pay | Admitting: Emergency Medicine

## 2023-08-03 DIAGNOSIS — K921 Melena: Secondary | ICD-10-CM | POA: Diagnosis not present

## 2023-08-03 DIAGNOSIS — Z7984 Long term (current) use of oral hypoglycemic drugs: Secondary | ICD-10-CM

## 2023-08-03 DIAGNOSIS — E1122 Type 2 diabetes mellitus with diabetic chronic kidney disease: Secondary | ICD-10-CM | POA: Diagnosis present

## 2023-08-03 DIAGNOSIS — E1100 Type 2 diabetes mellitus with hyperosmolarity without nonketotic hyperglycemic-hyperosmolar coma (NKHHC): Principal | ICD-10-CM | POA: Diagnosis present

## 2023-08-03 DIAGNOSIS — N189 Chronic kidney disease, unspecified: Secondary | ICD-10-CM

## 2023-08-03 DIAGNOSIS — E872 Acidosis, unspecified: Secondary | ICD-10-CM | POA: Diagnosis present

## 2023-08-03 DIAGNOSIS — Z885 Allergy status to narcotic agent status: Secondary | ICD-10-CM | POA: Diagnosis not present

## 2023-08-03 DIAGNOSIS — E1111 Type 2 diabetes mellitus with ketoacidosis with coma: Secondary | ICD-10-CM

## 2023-08-03 DIAGNOSIS — R339 Retention of urine, unspecified: Secondary | ICD-10-CM | POA: Diagnosis present

## 2023-08-03 DIAGNOSIS — R739 Hyperglycemia, unspecified: Secondary | ICD-10-CM | POA: Diagnosis present

## 2023-08-03 DIAGNOSIS — R3915 Urgency of urination: Secondary | ICD-10-CM | POA: Diagnosis present

## 2023-08-03 DIAGNOSIS — E119 Type 2 diabetes mellitus without complications: Secondary | ICD-10-CM

## 2023-08-03 DIAGNOSIS — E871 Hypo-osmolality and hyponatremia: Secondary | ICD-10-CM | POA: Diagnosis present

## 2023-08-03 DIAGNOSIS — Z79899 Other long term (current) drug therapy: Secondary | ICD-10-CM | POA: Diagnosis not present

## 2023-08-03 DIAGNOSIS — I7389 Other specified peripheral vascular diseases: Secondary | ICD-10-CM

## 2023-08-03 DIAGNOSIS — N179 Acute kidney failure, unspecified: Secondary | ICD-10-CM | POA: Diagnosis present

## 2023-08-03 DIAGNOSIS — E860 Dehydration: Secondary | ICD-10-CM | POA: Diagnosis present

## 2023-08-03 DIAGNOSIS — R350 Frequency of micturition: Secondary | ICD-10-CM | POA: Diagnosis present

## 2023-08-03 DIAGNOSIS — Z5329 Procedure and treatment not carried out because of patient's decision for other reasons: Secondary | ICD-10-CM | POA: Diagnosis present

## 2023-08-03 DIAGNOSIS — E1110 Type 2 diabetes mellitus with ketoacidosis without coma: Secondary | ICD-10-CM | POA: Diagnosis present

## 2023-08-03 DIAGNOSIS — I129 Hypertensive chronic kidney disease with stage 1 through stage 4 chronic kidney disease, or unspecified chronic kidney disease: Secondary | ICD-10-CM | POA: Diagnosis present

## 2023-08-03 DIAGNOSIS — N1832 Chronic kidney disease, stage 3b: Secondary | ICD-10-CM | POA: Diagnosis present

## 2023-08-03 LAB — CBC
HCT: 41.4 % (ref 39.0–52.0)
Hemoglobin: 14.7 g/dL (ref 13.0–17.0)
MCH: 28.9 pg (ref 26.0–34.0)
MCHC: 35.5 g/dL (ref 30.0–36.0)
MCV: 81.5 fL (ref 80.0–100.0)
Platelets: 328 10*3/uL (ref 150–400)
RBC: 5.08 MIL/uL (ref 4.22–5.81)
RDW: 12.2 % (ref 11.5–15.5)
WBC: 8.1 10*3/uL (ref 4.0–10.5)
nRBC: 0 % (ref 0.0–0.2)

## 2023-08-03 LAB — BASIC METABOLIC PANEL
Anion gap: 11 (ref 5–15)
Anion gap: 11 (ref 5–15)
BUN: 29 mg/dL — ABNORMAL HIGH (ref 6–20)
BUN: 29 mg/dL — ABNORMAL HIGH (ref 6–20)
CO2: 23 mmol/L (ref 22–32)
CO2: 24 mmol/L (ref 22–32)
Calcium: 9.7 mg/dL (ref 8.9–10.3)
Calcium: 10.3 mg/dL (ref 8.9–10.3)
Chloride: 86 mmol/L — ABNORMAL LOW (ref 98–111)
Chloride: 99 mmol/L (ref 98–111)
Creatinine, Ser: 1.98 mg/dL — ABNORMAL HIGH (ref 0.61–1.24)
Creatinine, Ser: 1.85 mg/dL — ABNORMAL HIGH (ref 0.61–1.24)
GFR, Estimated: 40 mL/min — ABNORMAL LOW (ref >60)
GFR, Estimated: 44 mL/min — ABNORMAL LOW (ref >60)
Glucose, Bld: 937 mg/dL (ref 70–99)
Glucose, Bld: 350 mg/dL — ABNORMAL HIGH (ref 70–99)
Potassium: 4.6 mmol/L (ref 3.5–5.1)
Potassium: 3.4 mmol/L — ABNORMAL LOW (ref 3.5–5.1)
Sodium: 120 mmol/L — ABNORMAL LOW (ref 135–145)
Sodium: 134 mmol/L — ABNORMAL LOW (ref 135–145)

## 2023-08-03 LAB — CBG MONITORING, ED
Glucose-Capillary: >600 mg/dL (ref 70–99)
Glucose-Capillary: >600 mg/dL (ref 70–99)
Glucose-Capillary: >600 mg/dL (ref 70–99)
Glucose-Capillary: 515 mg/dL (ref 70–99)
Glucose-Capillary: 280 mg/dL — ABNORMAL HIGH (ref 70–99)

## 2023-08-03 LAB — I-STAT VENOUS BLOOD GAS, ED
Acid-Base Excess: 1 mmol/L (ref 0.0–2.0)
Bicarbonate: 26 mmol/L (ref 20.0–28.0)
Calcium, Ion: 1.22 mmol/L (ref 1.15–1.40)
HCT: 43 % (ref 39.0–52.0)
Hemoglobin: 14.6 g/dL (ref 13.0–17.0)
O2 Saturation: 46 %
Potassium: 6.2 mmol/L — ABNORMAL HIGH (ref 3.5–5.1)
Sodium: 122 mmol/L — ABNORMAL LOW (ref 135–145)
TCO2: 27 mmol/L (ref 22–32)
pCO2, Ven: 44 mm[Hg] (ref 44–60)
pH, Ven: 7.38 (ref 7.25–7.43)
pO2, Ven: 26 mm[Hg] — CL (ref 32–45)

## 2023-08-03 LAB — GLUCOSE, CAPILLARY
Glucose-Capillary: 204 mg/dL — ABNORMAL HIGH (ref 70–99)
Glucose-Capillary: 227 mg/dL — ABNORMAL HIGH (ref 70–99)

## 2023-08-03 LAB — URINALYSIS, ROUTINE W REFLEX MICROSCOPIC
Bacteria, UA: NONE SEEN
Bilirubin Urine: NEGATIVE
Glucose, UA: >1000 mg/dL — AB
Hgb urine dipstick: NEGATIVE
Ketones, ur: NEGATIVE mg/dL
Leukocytes,Ua: NEGATIVE
Nitrite: NEGATIVE
Protein, ur: NEGATIVE mg/dL
Specific Gravity, Urine: 1.022 (ref 1.005–1.030)
pH: 5 (ref 5.0–8.0)

## 2023-08-03 LAB — OSMOLALITY: Osmolality: 334 mosm/kg (ref 275–295)

## 2023-08-03 LAB — TROPONIN I (HIGH SENSITIVITY)
Troponin I (High Sensitivity): 10 ng/L (ref <18)
Troponin I (High Sensitivity): 10 ng/L (ref <18)

## 2023-08-03 LAB — MRSA NEXT GEN BY PCR, NASAL: MRSA by PCR Next Gen: NOT DETECTED

## 2023-08-03 LAB — BETA-HYDROXYBUTYRIC ACID: Beta-Hydroxybutyric Acid: 0.84 mmol/L — ABNORMAL HIGH (ref 0.05–0.27)

## 2023-08-03 MED ORDER — DEXTROSE 50 % IV SOLN: 0.0000 mL | INTRAVENOUS | Status: DC | PRN

## 2023-08-03 MED ORDER — HYDRALAZINE HCL 20 MG/ML IJ SOLN
10.0000 mg | Freq: Four times a day (QID) | INTRAMUSCULAR | Status: DC | PRN
  Administered 2023-08-03: 10 mg via INTRAVENOUS

## 2023-08-03 MED ORDER — SODIUM CHLORIDE 0.9% FLUSH: 3.0000 mL | INTRAVENOUS | Status: DC | PRN

## 2023-08-03 MED ORDER — SODIUM CHLORIDE 0.9 % IV SOLN
250.0000 mL | INTRAVENOUS | Status: DC | PRN
Start: 2023-08-03 — End: 2023-08-04

## 2023-08-03 MED ORDER — ONDANSETRON HCL 4 MG/2ML IJ SOLN
4.0000 mg | Freq: Four times a day (QID) | INTRAMUSCULAR | Status: DC | PRN
Start: 2023-08-03 — End: 2023-08-06
  Administered 2023-08-04 (×3): 4 mg via INTRAVENOUS
  Filled 2023-08-03 (×3): qty 2

## 2023-08-03 MED ORDER — CHLORHEXIDINE GLUCONATE CLOTH 2 % EX PADS
6.0000 | MEDICATED_PAD | Freq: Every day | CUTANEOUS | Status: DC
  Administered 2023-08-04 (×2): 6 via TOPICAL

## 2023-08-03 MED ORDER — ORAL CARE MOUTH RINSE: 15.0000 mL | OROMUCOSAL | Status: DC | PRN

## 2023-08-03 MED ORDER — ACETAMINOPHEN 650 MG RE SUPP: 650.0000 mg | Freq: Four times a day (QID) | RECTAL | Status: DC | PRN

## 2023-08-03 MED ORDER — HYDROMORPHONE HCL 1 MG/ML IJ SOLN
1.0000 mg | Freq: Once | INTRAMUSCULAR | Status: AC
Start: 2023-08-03 — End: 2023-08-03
  Administered 2023-08-03: 1 mg via INTRAVENOUS
  Filled 2023-08-03: qty 1

## 2023-08-03 MED ORDER — ONDANSETRON HCL 4 MG PO TABS: 4.0000 mg | ORAL_TABLET | Freq: Four times a day (QID) | ORAL | Status: DC | PRN

## 2023-08-03 MED ORDER — LACTATED RINGERS IV SOLN: INTRAVENOUS | Status: DC

## 2023-08-03 MED ORDER — POTASSIUM CHLORIDE 10 MEQ/100ML IV SOLN
10.0000 meq | INTRAVENOUS | Status: AC
  Administered 2023-08-03 (×2): 10 meq via INTRAVENOUS
  Filled 2023-08-03 (×2): qty 100

## 2023-08-03 MED ORDER — ENOXAPARIN SODIUM 60 MG/0.6ML IJ SOSY
60.0000 mg | PREFILLED_SYRINGE | INTRAMUSCULAR | Status: DC
  Filled 2023-08-03 (×3): qty 0.6

## 2023-08-03 MED ORDER — DEXTROSE IN LACTATED RINGERS 5 % IV SOLN: INTRAVENOUS | Status: DC

## 2023-08-03 MED ORDER — LACTATED RINGERS IV BOLUS
20.0000 mL/kg | Freq: Once | INTRAVENOUS | Status: AC
Start: 2023-08-03 — End: 2023-08-03
  Administered 2023-08-03: 2540 mL via INTRAVENOUS

## 2023-08-03 MED ORDER — ACETAMINOPHEN 325 MG PO TABS
650.0000 mg | ORAL_TABLET | Freq: Four times a day (QID) | ORAL | Status: DC | PRN
  Administered 2023-08-04: 650 mg via ORAL
  Filled 2023-08-03: qty 2

## 2023-08-03 MED ORDER — HYDRALAZINE HCL 20 MG/ML IJ SOLN
5.0000 mg | Freq: Four times a day (QID) | INTRAMUSCULAR | Status: DC | PRN
  Filled 2023-08-03: qty 1

## 2023-08-03 MED ORDER — INSULIN REGULAR(HUMAN) IN NACL 100-0.9 UT/100ML-% IV SOLN
INTRAVENOUS | Status: DC
  Administered 2023-08-03: 13 [IU]/h via INTRAVENOUS
  Administered 2023-08-04: 9.5 [IU]/h via INTRAVENOUS
  Filled 2023-08-03 (×2): qty 100

## 2023-08-03 MED ORDER — SODIUM CHLORIDE 0.9% FLUSH
3.0000 mL | Freq: Two times a day (BID) | INTRAVENOUS | Status: DC
  Administered 2023-08-03 – 2023-08-04 (×2): 3 mL via INTRAVENOUS

## 2023-08-03 MED ORDER — SODIUM CHLORIDE 0.9% FLUSH
3.0000 mL | Freq: Two times a day (BID) | INTRAVENOUS | Status: DC
  Administered 2023-08-03 – 2023-08-06 (×6): 3 mL via INTRAVENOUS

## 2023-08-03 NOTE — ED Provider Notes (Signed)
[place] EMERGENCY DEPARTMENT AT [facility] Provider Note   CSN: [id number] Arrival date & time: 08/03/23  1625     History  Chief Complaint  Patient presents with   Hyperglycemia    Charles Farmer is a 53 y.o. male.  53 year old male with history of type 2 diabetes on glipizide and Jardiance presenting for hyperglycemia greater than 500.  Visited his PCP today noted to have A1c of 15 up from 7.7 3 months ago.  Advised to visit ED due to hyperglycemia.  He has not taken insulin before.  Reports about 1 week ago started developing some lower abdominal pain and has had significant polyuria and polydipsia as well.  His home blood sugars have been greater than 500 for over a week.  Denies any nausea or vomiting, weakness, headache.  Denies chest pain or shortness of breath.  No recent illness.    The history is provided by the patient.  Hyperglycemia Associated symptoms: abdominal pain   Associated symptoms: no chest pain, no dizziness, no dysuria, no fever, no nausea, no shortness of breath, no vomiting and no weakness        Home Medications Prior to Admission medications   Medication Sig Start Date End Date Taking? Authorizing Provider  albuterol (VENTOLIN HFA) 108 (90 Base) MCG/ACT inhaler Inhale into the lungs.    [provider]  Antihemophil Factor, rAHF-PFM, (ADVATE) 4000 units SOLR Inject 1 application  into the vein once as needed.    [provider]  empagliflozin (JARDIANCE) 10 MG TABS tablet Take 1 tablet by mouth daily. 07/20/23   [provider]  finasteride (PROSCAR) 5 MG tablet Take 5 mg by mouth daily. 05/31/22   [provider]  glipiZIDE (GLUCOTROL XL) 5 MG 24 hr tablet Take 5 mg by mouth daily with breakfast.    [provider]  lisinopril-hydrochlorothiazide (ZESTORETIC) 20-25 MG tablet Take 1 tablet by mouth daily. 04/07/22   [provider]  metoprolol succinate (TOPROL-XL) 50 MG 24 hr tablet Take  50 mg by mouth. 01/16/20   [provider]  tamsulosin (FLOMAX) 0.4 MG CAPS capsule Take 1 capsule by mouth daily. 05/26/22   [provider]      Allergies    Codeine    Review of Systems   Review of Systems  Constitutional:  Negative for fever.  Respiratory:  Negative for shortness of breath.   Cardiovascular:  Negative for chest pain and leg swelling.  Gastrointestinal:  Positive for abdominal pain. Negative for constipation, diarrhea, nausea and vomiting.  Genitourinary:  Positive for frequency and urgency. Negative for dysuria.  Neurological:  Negative for dizziness, weakness, numbness and headaches.    Physical Exam Updated Vital Signs BP (!) 132/96 (BP Location: Left Arm)   Pulse 74   Temp 98 F (36.7 C) (Oral)   Resp 18   Ht 5\' 7"  (1.702 m)   Wt 127 kg   SpO2 98%   BMI 43.85 kg/m  Physical Exam Constitutional:      General: He is not in acute distress. HENT:     Head: Normocephalic and atraumatic.     Mouth/Throat:     Mouth: Mucous membranes are dry.  Eyes:     Extraocular Movements: Extraocular movements intact.     Pupils: Pupils are equal, round, and reactive to light.  Cardiovascular:     Rate and Rhythm: Regular rhythm.     Heart sounds: Normal heart sounds. No murmur heard. Pulmonary:  Effort: No respiratory distress.     Breath sounds: Normal breath sounds. No wheezing, rhonchi or rales.  Abdominal:     General: Abdomen is flat.     Palpations: Abdomen is soft.     Tenderness: There is abdominal tenderness in the suprapubic area.  Musculoskeletal:        General: No swelling or deformity.     Right lower leg: No edema.     Left lower leg: No edema.  Neurological:     General: No focal deficit present.     Mental Status: He is alert.     Motor: No weakness.     ED Results / Procedures / Treatments   Labs (all labs ordered are listed, but only abnormal results are displayed) Labs Reviewed  BASIC METABOLIC PANEL -  Abnormal; Notable for the following components:      Result Value   Sodium 120 (*)    Chloride 86 (*)    Glucose, Bld 937 (*)    BUN 29 (*)    Creatinine, Ser 1.98 (*)    GFR, Estimated 40 (*)    All other components within normal limits  CBG MONITORING, ED - Abnormal; Notable for the following components:   Glucose-Capillary >600 (*)    All other components within normal limits  I-STAT VENOUS BLOOD GAS, ED - Abnormal; Notable for the following components:   pO2, Ven 26 (*)    Sodium 122 (*)    Potassium 6.2 (*)    All other components within normal limits  CBG MONITORING, ED - Abnormal; Notable for the following components:   Glucose-Capillary >600 (*)    All other components within normal limits  CBC  URINALYSIS, ROUTINE W REFLEX MICROSCOPIC  BETA-HYDROXYBUTYRIC ACID  OSMOLALITY  TROPONIN I (HIGH SENSITIVITY)    EKG EKG Interpretation Date/Time:  Thursday August 03 2023 17:35:15 EST Ventricular Rate:  79 PR Interval:  163 QRS Duration:  78 QT Interval:  353 QTC Calculation: 405 R Axis:   61  Text Interpretation: Sinus rhythm Anterolateral ST elevation, probable early repolarization Confirmed by Glyn Ade [phone]) on 08/03/2023 5:40:35 PM  Radiology No results found.  Procedures Procedures    Medications Ordered in ED Medications  insulin regular, human (MYXREDLIN) 100 units/ 100 mL infusion (13 Units/hr Intravenous New Bag/Given 08/03/23 1758)  lactated ringers infusion (has no administration in time range)  dextrose 5 % in lactated ringers infusion (has no administration in time range)  dextrose 50 % solution 0-50 mL (has no administration in time range)  potassium chloride 10 mEq in 100 mL IVPB (10 mEq Intravenous New Bag/Given 08/03/23 1803)  lactated ringers bolus 2,540 mL (2,540 mLs Intravenous New Bag/Given 08/03/23 1750)    ED Course/ Medical Decision Making/ A&P                                 Medical Decision Making 53 year old male with  history of uncontrolled type 2 diabetes presenting due to hyperglycemia and 1 week history of lower abdominal pain.  Also with significant urinary frequency and urgency.  Has had significant worsening of his diabetes over the past 3 months.  Patient reports adherence to medications but is insulin nave.  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  No evidence of DKA given normal bicarb and normal pH on VBG.  Potassium 4.6.  Also has AKI with creatinine 1.98, baseline appears to  be around 1.3-1.4.  Corrected sodium 133.  EKG shows likely repolarization abnormalities with J-point elevation and anterolateral ST elevations.  Low suspicion for ACS but will check troponin.  Started EndoTool with potassium.  Patient will require admission due to [facility] and further optimization of his diabetes/insulin regimen.  Spoke with Dr. Mahala Menghini with [facility] via [facility] - pt to be admitted to stepdown unit.  Amount and/or Complexity of Data Reviewed Labs: ordered.  Risk Prescription drug management. Decision regarding hospitalization.           Final Clinical Impression(s) / ED Diagnoses Final diagnoses:  None    Rx / DC Orders ED Discharge Orders     None         Vonna Drafts, MD 08/03/23 Lincoln Brigham, MD 08/03/23 2258

## 2023-08-03 NOTE — ED Triage Notes (Signed)
Sent from PCP, was told his A1C was above 15. Has not been taking insulin. C/o lower abd pain and increased urinary frequency.

## 2023-08-03 NOTE — H&P (Addendum)
History and Physical    Charles Farmer ZOX:[id number] DOB: [birth year] DOA: 08/03/2023  PCP: [facility]   Patient coming from: Home   Chief Complaint:  Chief Complaint  Patient presents with   Hyperglycemia   ED TRIAGE note: Sent from PCP, was told his A1C was above 15. Has not been taking insulin. C/o lower abd pain and increased urinary frequency.   HPI:  Charles Farmer is a 53 y.o. male with medical history significant of DM type II on glipizide and Jardiance presented to emergency department as at home patient found to blood glucose is above 500.  Patient visited his PCP today found A1c 15 which is tenidap from 7.73.  Patient was advised to go to the emergency department for evaluation.  Patient reported that 1 week ago he started developing lower abdominal pain and having significant polyuria and polydipsia as well. In the ED workup revealed blood glucose is above 900 on BMP.  No evidence of DKA given patient has normal bicarb and pH PBG.  Potassium 4.6.  Patient also has AKI with elevated creatinine 1.98 baseline around 1.3-1.4.  Corrected sodium 133. In the ED Endo tool with HHS protocol has been initiated.  Patient has been transferred to [facility] for further management of HHS and AKI in the setting of HHS.  During my evaluation at the bedside patient reported that he has suprapubic abdominal discomfort.  Patient denies any headache, chest pain, chest pressure, shortness of breath, blurry vision, generalized abdominal pain, nausea, vomiting, diarrhea, constipation, change of weight and appetite.   Significant labs in the ED: Lab Orders         MRSA Next Gen by PCR, Nasal         Basic metabolic panel         CBC         Urinalysis, Routine w reflex microscopic -Urine, Clean Catch         Beta-hydroxybutyric acid         Osmolality         Basic metabolic panel         Glucose, capillary         Basic metabolic panel         CBC         HIV  Antibody (routine testing w rflx)         Sodium, urine, random         Creatinine, urine, random         Osmolality         Glucose, capillary         Glucose, capillary         Hemoglobin A1c         CBG monitoring, ED         I-Stat venous blood gas, ED         CBG monitoring, ED         CBG monitoring, ED         CBG monitoring, ED         CBG monitoring, ED       Review of Systems:  Review of Systems  Constitutional:  Negative for chills, fever, malaise/fatigue and weight loss.  Respiratory:  Negative for cough and shortness of breath.   Cardiovascular:  Negative for chest pain and palpitations.  Gastrointestinal:  Negative for abdominal pain, heartburn, nausea and vomiting.  Genitourinary:  Negative for dysuria, frequency, hematuria and urgency.  Musculoskeletal:  Negative for back pain, joint pain, myalgias and neck pain.  Neurological:  Negative for dizziness and headaches.  Endo/Heme/Allergies:  Positive for polydipsia. Negative for environmental allergies. Does not bruise/bleed easily.  Psychiatric/Behavioral:  The patient is not nervous/anxious.   All other systems reviewed and are negative.   Past Medical History:  Diagnosis Date   Diabetes mellitus without complication (HCC)    Hemophilia (HCC)    Hypertension     History reviewed. No pertinent surgical history.   reports that he has never smoked. He has never used smokeless tobacco. He reports that he does not currently use alcohol. He reports that he does not currently use drugs.  Allergies  Allergen Reactions   Codeine Hives    History reviewed. No pertinent family history.  Prior to Admission medications   Medication Sig Start Date End Date Taking? Authorizing Provider  albuterol (VENTOLIN HFA) 108 (90 Base) MCG/ACT inhaler Inhale into the lungs.    [provider]  Antihemophil Factor, rAHF-PFM, (ADVATE) 4000 units SOLR Inject 1 application  into the vein once as needed.    [provider]  empagliflozin (JARDIANCE) 10 MG TABS tablet Take 1 tablet by mouth daily. 07/20/23   [provider]  finasteride (PROSCAR) 5 MG tablet Take 5 mg by mouth daily. 05/31/22   [provider]  glipiZIDE (GLUCOTROL XL) 5 MG 24 hr tablet Take 5 mg by mouth daily with breakfast.    [provider]  lisinopril-hydrochlorothiazide (ZESTORETIC) 20-25 MG tablet Take 1 tablet by mouth daily. 04/07/22   [provider]  metoprolol succinate (TOPROL-XL) 50 MG 24 hr tablet Take 50 mg by mouth. 01/16/20   [provider]  tamsulosin (FLOMAX) 0.4 MG CAPS capsule Take 1 capsule by mouth daily. 05/26/22   [provider]     Physical Exam: Vitals:   08/03/23 2100 08/03/23 2200 08/03/23 2330 08/04/23 0000  BP: (!) 129/97 (!) 159/108 (!) 182/115 (!) 174/96  Pulse: 82 81 77 82  Resp: 14 11 16 16   Temp:  97.6 F (36.4 C)  97.7 F (36.5 C)  TempSrc:  Oral    SpO2: 100% 95% 93% 98%  Weight:  119 kg    Height:        Physical Exam Constitutional:      General: He is not in acute distress.    Appearance: He is not ill-appearing.  HENT:     Mouth/Throat:     Mouth: Mucous membranes are moist.  Eyes:     Conjunctiva/sclera: Conjunctivae normal.  Cardiovascular:     Rate and Rhythm: Normal rate and regular rhythm.     Pulses: Normal pulses.     Heart sounds: Normal heart sounds.  Pulmonary:     Effort: Pulmonary effort is normal.     Breath sounds: Normal breath sounds.  Abdominal:     General: Bowel sounds are normal. There is no distension.     Palpations: Abdomen is soft.     Tenderness: There is no abdominal tenderness. There is no guarding.  Musculoskeletal:     Cervical back: Neck supple.     Right lower leg: No edema.     Left lower leg: No edema.  Skin:    Capillary Refill: Capillary refill takes less than 2 seconds.  Neurological:     Mental Status: He is alert and oriented to person, place, and time.  Psychiatric:         Mood and Affect: Mood  normal.        Thought Content: Thought content normal.      Labs on Admission: I have personally reviewed following labs and imaging studies  CBC: Recent Labs  Lab 08/03/23 1638 08/03/23 1741  WBC 8.1  --   HGB 14.7 14.6  HCT 41.4 43.0  MCV 81.5  --   PLT 328  --    Basic Metabolic Panel: Recent Labs  Lab 08/03/23 1638 08/03/23 1741 08/03/23 2010  NA 120* 122* 134*  K 4.6 6.2* 3.4*  CL 86*  --  99  CO2 23  --  24  GLUCOSE 937*  --  350*  BUN 29*  --  29*  CREATININE 1.98*  --  1.85*  CALCIUM 9.7  --  10.3   GFR: Estimated Creatinine Clearance: 59 mL/min (A) (by C-G formula based on SCr of 1.85 mg/dL (H)). Liver Function Tests: No results for input(s): "AST", "ALT", "ALKPHOS", "BILITOT", "PROT", "ALBUMIN" in the last 168 hours. No results for input(s): "LIPASE", "AMYLASE" in the last 168 hours. No results for input(s): "AMMONIA" in the last 168 hours. Coagulation Profile: No results for input(s): "INR", "PROTIME" in the last 168 hours. Cardiac Enzymes: Recent Labs  Lab 08/03/23 1751 08/03/23 1945  TROPONINIHS 10 10   BNP (last 3 results) No results for input(s): "BNP" in the last 8760 hours. HbA1C: No results for input(s): "HGBA1C" in the last 72 hours. CBG: Recent Labs  Lab 08/03/23 1946 08/03/23 2046 08/03/23 2149 08/03/23 2300 08/03/23 2359  GLUCAP 515* 280* 204* 227* 255*   Lipid Profile: No results for input(s): "CHOL", "HDL", "LDLCALC", "TRIG", "CHOLHDL", "LDLDIRECT" in the last 72 hours. Thyroid Function Tests: No results for input(s): "TSH", "T4TOTAL", "FREET4", "T3FREE", "THYROIDAB" in the last 72 hours. Anemia Panel: No results for input(s): "VITAMINB12", "FOLATE", "FERRITIN", "TIBC", "IRON", "RETICCTPCT" in the last 72 hours. Urine analysis:    Component Value Date/Time   COLORURINE COLORLESS (A) 08/03/2023 1638   APPEARANCEUR CLEAR 08/03/2023 1638   LABSPEC 1.022 08/03/2023 1638   PHURINE 5.0 08/03/2023 1638    GLUCOSEU >1,000 (A) 08/03/2023 1638   HGBUR NEGATIVE 08/03/2023 1638   BILIRUBINUR NEGATIVE 08/03/2023 1638   KETONESUR NEGATIVE 08/03/2023 1638   PROTEINUR NEGATIVE 08/03/2023 1638   NITRITE NEGATIVE 08/03/2023 1638   LEUKOCYTESUR NEGATIVE 08/03/2023 1638    Radiological Exams on Admission: I have personally reviewed images No results found.   EKG: My personal interpretation of EKG shows: Sinus rhythm heart rate 79.    Assessment/Plan: Principal Problem:   Hyperosmolar hyperglycemic state (HHS) (HCC) Active Problems:   Non-insulin dependent type 2 diabetes mellitus (HCC)   Acute kidney injury superimposed on chronic kidney disease (HCC)    Assessment and Plan: Hyperosmolar hyperglycemic crisis Non-insulin-dependent DM type II Hyponatremia secondary to hyperglycemia -Patient presenting to the emergency department referred from the primary care clinic as A1c found elevated 7.7-15 and POC blood glucose was above 500. - Presentation to ED patient is hemodynamically stable. - BMP showed low sodium 120 entheses corrected sodium 133, low chloride 86, elevated blood glucose 937,, normal bicarb 23 normal anion gap 11, elevated BUN 29, elevated creatinine 1.98 and low GFR 50.  Elevated serum osmolarity 334.  VBG showed normal pH 7.3 and bicarb low. -For the management of HHS patient being started on insulin drip per Endo tool protocol while in the ED. - Plan to continue insulin drip, continue to check POC blood glucose every 2 hours, checking BMP every 4 hours. - Continue maintenance  fluid either LR or D5 LR based on the blood glucose level. - Once blood glucose will be around 200 range and BMP will show normal bicarb and anion gap level will bridge with long-acting insulin to insulin drip and wean of the insulin drip in 2 hours. - Checking stat BMP and continue to check POC blood glucose every 2 hours. -Continue clear liquid diet. -Continue cardiac monitoring. - Consulted inpatient  diabetic educator.  Acute kidney injury secondary to dehydration Acute kidney injury on CKD stage IIIa -Elevated creatinine 1.98.  Baseline creatinine around 1.3-1.5.  AKI in the setting of dehydration from HHS. - Continue maintenance fluid either LR or D5 LR.  Once patient would be out of the HHS  will decrease fluid to LR 75 cc/h. -Continue monitor renal function, nephrotoxic agent and monitor urine output.   Elevated blood pressure - Patient was normotensive however gradually patient blood pressure is gradually trending up.  Unsure if patient has underlying history of hypertension or not. - Blood pressure is 182/115 gave hydralazine 10 mg without much improvement.  Increasing dose of hydralazine 20 mg every 6 as needed for SBP above 160 or DBP above 110    DVT prophylaxis:  Lovenox Code Status:  Full Code Diet: Clear liquid diet. Family Communication:   None present Disposition Plan: Continue to monitor improvement of blood glucose level and will transition insulin drip to subcu insulin. Consults: Diabetic educator Admission status:   Inpatient, Step Down Unit  Severity of Illness: The appropriate patient status for this patient is INPATIENT. Inpatient status is judged to be reasonable and necessary in order to provide the required intensity of service to ensure the patient's safety. The patient's presenting symptoms, physical exam findings, and initial radiographic and laboratory data in the context of their chronic comorbidities is felt to place them at high risk for further clinical deterioration. Furthermore, it is not anticipated that the patient will be medically stable for discharge from the hospital within 2 midnights of admission.   * I certify that at the point of admission it is my clinical judgment that the patient will require inpatient hospital care spanning beyond 2 midnights from the point of admission due to high intensity of service, high risk for further deterioration  and high frequency of surveillance required.Marland Kitchen    Tereasa Coop, MD [facility]  How to contact the [facility] Attending or Consulting provider 7A - 7P or covering provider during after hours 7P -7A, for this patient.  Check the care team in [facility] and look for a) attending/consulting [facility] provider listed and b) the [facility] team listed Log into [email or web address] and use [place]'s universal password to access. If you do not have the password, please contact the hospital operator. Locate the [facility] provider you are looking for under [facility] and page to a number that you can be directly reached. If you still have difficulty reaching the provider, please page the [facility] (Director on Call) for the Hospitalists listed on amion for assistance.  08/04/2023, 12:18 AM

## 2023-08-03 NOTE — ED Provider Notes (Signed)
res   Glyn Ade, MD 08/03/23 2043

## 2023-08-03 NOTE — ED Notes (Signed)
RT obtained VBG from pt PIV. RT reported results to MD.   Latest Reference Range & Units Most Recent  Sample type  VENOUS 08/03/23 17:41  pH, Ven 7.25 - 7.43  7.380 08/03/23 17:41  pCO2, Ven 44 - 60 mmHg 44.0 08/03/23 17:41  pO2, Ven 32 - 45 mmHg 26 (LL) 08/03/23 17:41  TCO2 22 - 32 mmol/L 27 08/03/23 17:41  Acid-Base Excess 0.0 - 2.0 mmol/L 1.0 08/03/23 17:41  Bicarbonate 20.0 - 28.0 mmol/L 26.0 08/03/23 17:41  O2 Saturation % 46 08/03/23 17:41  Collection site  IV start 08/03/23 17:41  (LL): Data is critically low

## 2023-08-03 NOTE — ED Notes (Signed)
Received critical result for pt while pt is in route to [facility] hospital. Attempted to call Rn accepting pt to room 1241 x1 with no answer

## 2023-08-04 ENCOUNTER — Other Ambulatory Visit (HOSPITAL_COMMUNITY): Payer: Self-pay

## 2023-08-04 ENCOUNTER — Telehealth (HOSPITAL_COMMUNITY): Payer: Self-pay | Admitting: Pharmacy Technician

## 2023-08-04 DIAGNOSIS — E1100 Type 2 diabetes mellitus with hyperosmolarity without nonketotic hyperglycemic-hyperosmolar coma (NKHHC): Secondary | ICD-10-CM | POA: Diagnosis not present

## 2023-08-04 LAB — BASIC METABOLIC PANEL
Anion gap: 12 (ref 5–15)
Anion gap: 12 (ref 5–15)
Anion gap: 14 (ref 5–15)
Anion gap: 13 (ref 5–15)
BUN: 28 mg/dL — ABNORMAL HIGH (ref 6–20)
BUN: 28 mg/dL — ABNORMAL HIGH (ref 6–20)
BUN: 30 mg/dL — ABNORMAL HIGH (ref 6–20)
BUN: 31 mg/dL — ABNORMAL HIGH (ref 6–20)
CO2: 20 mmol/L — ABNORMAL LOW (ref 22–32)
CO2: 20 mmol/L — ABNORMAL LOW (ref 22–32)
CO2: 21 mmol/L — ABNORMAL LOW (ref 22–32)
CO2: 15 mmol/L — ABNORMAL LOW (ref 22–32)
Calcium: 9.8 mg/dL (ref 8.9–10.3)
Calcium: 9.9 mg/dL (ref 8.9–10.3)
Calcium: 10 mg/dL (ref 8.9–10.3)
Calcium: 9.3 mg/dL (ref 8.9–10.3)
Chloride: 103 mmol/L (ref 98–111)
Chloride: 103 mmol/L (ref 98–111)
Chloride: 99 mmol/L (ref 98–111)
Chloride: 95 mmol/L — ABNORMAL LOW (ref 98–111)
Creatinine, Ser: 1.73 mg/dL — ABNORMAL HIGH (ref 0.61–1.24)
Creatinine, Ser: 1.72 mg/dL — ABNORMAL HIGH (ref 0.61–1.24)
Creatinine, Ser: 2.3 mg/dL — ABNORMAL HIGH (ref 0.61–1.24)
Creatinine, Ser: 2.93 mg/dL — ABNORMAL HIGH (ref 0.61–1.24)
GFR, Estimated: 47 mL/min — ABNORMAL LOW (ref >60)
GFR, Estimated: 48 mL/min — ABNORMAL LOW (ref >60)
GFR, Estimated: 34 mL/min — ABNORMAL LOW (ref >60)
GFR, Estimated: 25 mL/min — ABNORMAL LOW (ref >60)
Glucose, Bld: 255 mg/dL — ABNORMAL HIGH (ref 70–99)
Glucose, Bld: 279 mg/dL — ABNORMAL HIGH (ref 70–99)
Glucose, Bld: 190 mg/dL — ABNORMAL HIGH (ref 70–99)
Glucose, Bld: 514 mg/dL (ref 70–99)
Potassium: 3.2 mmol/L — ABNORMAL LOW (ref 3.5–5.1)
Potassium: 3.4 mmol/L — ABNORMAL LOW (ref 3.5–5.1)
Potassium: 3.5 mmol/L (ref 3.5–5.1)
Potassium: 3.8 mmol/L (ref 3.5–5.1)
Sodium: 135 mmol/L (ref 135–145)
Sodium: 135 mmol/L (ref 135–145)
Sodium: 134 mmol/L — ABNORMAL LOW (ref 135–145)
Sodium: 123 mmol/L — ABNORMAL LOW (ref 135–145)

## 2023-08-04 LAB — CBC
HCT: 40.8 % (ref 39.0–52.0)
Hemoglobin: 14.4 g/dL (ref 13.0–17.0)
MCH: 29.3 pg (ref 26.0–34.0)
MCHC: 35.3 g/dL (ref 30.0–36.0)
MCV: 82.9 fL (ref 80.0–100.0)
Platelets: 318 10*3/uL (ref 150–400)
RBC: 4.92 MIL/uL (ref 4.22–5.81)
RDW: 12.3 % (ref 11.5–15.5)
WBC: 11.9 10*3/uL — ABNORMAL HIGH (ref 4.0–10.5)
nRBC: 0 % (ref 0.0–0.2)

## 2023-08-04 LAB — GLUCOSE, CAPILLARY
Glucose-Capillary: 172 mg/dL — ABNORMAL HIGH (ref 70–99)
Glucose-Capillary: 194 mg/dL — ABNORMAL HIGH (ref 70–99)
Glucose-Capillary: 203 mg/dL — ABNORMAL HIGH (ref 70–99)
Glucose-Capillary: 217 mg/dL — ABNORMAL HIGH (ref 70–99)
Glucose-Capillary: 248 mg/dL — ABNORMAL HIGH (ref 70–99)
Glucose-Capillary: 252 mg/dL — ABNORMAL HIGH (ref 70–99)
Glucose-Capillary: 255 mg/dL — ABNORMAL HIGH (ref 70–99)
Glucose-Capillary: 265 mg/dL — ABNORMAL HIGH (ref 70–99)
Glucose-Capillary: 283 mg/dL — ABNORMAL HIGH (ref 70–99)
Glucose-Capillary: 286 mg/dL — ABNORMAL HIGH (ref 70–99)
Glucose-Capillary: 291 mg/dL — ABNORMAL HIGH (ref 70–99)
Glucose-Capillary: 458 mg/dL — ABNORMAL HIGH (ref 70–99)
Glucose-Capillary: 483 mg/dL — ABNORMAL HIGH (ref 70–99)
Glucose-Capillary: >600 mg/dL (ref 70–99)
Glucose-Capillary: >600 mg/dL (ref 70–99)

## 2023-08-04 LAB — HIV ANTIBODY (ROUTINE TESTING W REFLEX): HIV Screen 4th Generation wRfx: NONREACTIVE

## 2023-08-04 LAB — CREATININE, URINE, RANDOM: Creatinine, Urine: 42 mg/dL

## 2023-08-04 LAB — BETA-HYDROXYBUTYRIC ACID
Beta-Hydroxybutyric Acid: 0.42 mmol/L — ABNORMAL HIGH (ref 0.05–0.27)
Beta-Hydroxybutyric Acid: 0.84 mmol/L — ABNORMAL HIGH (ref 0.05–0.27)

## 2023-08-04 LAB — SODIUM, URINE, RANDOM: Sodium, Ur: <10 mmol/L

## 2023-08-04 LAB — OSMOLALITY: Osmolality: 309 mosm/kg — ABNORMAL HIGH (ref 275–295)

## 2023-08-04 MED ORDER — METOPROLOL SUCCINATE ER 25 MG PO TB24: 50.0000 mg | ORAL_TABLET | Freq: Every day | ORAL | Status: DC

## 2023-08-04 MED ORDER — LIDOCAINE HCL URETHRAL/MUCOSAL 2 % EX GEL
1.0000 | Freq: Once | CUTANEOUS | Status: AC
  Administered 2023-08-04: 1 via URETHRAL
  Filled 2023-08-04: qty 5

## 2023-08-04 MED ORDER — INSULIN GLARGINE-YFGN 100 UNIT/ML ~~LOC~~ SOLN
35.0000 [IU] | Freq: Every day | SUBCUTANEOUS | Status: DC
  Administered 2023-08-05 – 2023-08-06 (×2): 35 [IU] via SUBCUTANEOUS
  Filled 2023-08-04 (×2): qty 0.35

## 2023-08-04 MED ORDER — KETOROLAC TROMETHAMINE 15 MG/ML IJ SOLN
15.0000 mg | Freq: Once | INTRAMUSCULAR | Status: AC
  Administered 2023-08-04: 15 mg via INTRAVENOUS
  Filled 2023-08-04: qty 1

## 2023-08-04 MED ORDER — FINASTERIDE 5 MG PO TABS
5.0000 mg | ORAL_TABLET | Freq: Every day | ORAL | Status: DC
  Administered 2023-08-04 – 2023-08-06 (×3): 5 mg via ORAL
  Filled 2023-08-04 (×2): qty 1

## 2023-08-04 MED ORDER — POTASSIUM CHLORIDE 20 MEQ PO PACK
40.0000 meq | PACK | Freq: Once | ORAL | Status: AC
  Administered 2023-08-04: 40 meq via ORAL
  Filled 2023-08-04: qty 2

## 2023-08-04 MED ORDER — POTASSIUM CHLORIDE 10 MEQ/100ML IV SOLN: 10.0000 meq | INTRAVENOUS | Status: DC

## 2023-08-04 MED ORDER — INSULIN ASPART 100 UNIT/ML IJ SOLN
10.0000 [IU] | Freq: Once | INTRAMUSCULAR | Status: AC
  Administered 2023-08-04: 10 [IU] via SUBCUTANEOUS

## 2023-08-04 MED ORDER — SODIUM CHLORIDE 0.9 % IV BOLUS
500.0000 mL | Freq: Once | INTRAVENOUS | Status: AC
  Administered 2023-08-04: 500 mL via INTRAVENOUS

## 2023-08-04 MED ORDER — EMPAGLIFLOZIN 10 MG PO TABS
10.0000 mg | ORAL_TABLET | Freq: Every day | ORAL | Status: DC
  Administered 2023-08-04: 10 mg via ORAL
  Filled 2023-08-04: qty 1

## 2023-08-04 MED ORDER — HYDROMORPHONE HCL 1 MG/ML IJ SOLN
0.5000 mg | Freq: Once | INTRAMUSCULAR | Status: AC
  Administered 2023-08-04: 0.5 mg via INTRAVENOUS
  Filled 2023-08-04: qty 1

## 2023-08-04 MED ORDER — INSULIN ASPART 100 UNIT/ML IJ SOLN
0.0000 [IU] | Freq: Three times a day (TID) | INTRAMUSCULAR | Status: DC
  Administered 2023-08-04: 8 [IU] via SUBCUTANEOUS
  Administered 2023-08-04: 15 [IU] via SUBCUTANEOUS
  Administered 2023-08-05: 5 [IU] via SUBCUTANEOUS
  Administered 2023-08-05: 8 [IU] via SUBCUTANEOUS
  Administered 2023-08-06: 2 [IU] via SUBCUTANEOUS
  Administered 2023-08-06: 3 [IU] via SUBCUTANEOUS

## 2023-08-04 MED ORDER — INSULIN ASPART 100 UNIT/ML IJ SOLN
7.0000 [IU] | Freq: Once | INTRAMUSCULAR | Status: AC
  Administered 2023-08-04: 7 [IU] via INTRAVENOUS

## 2023-08-04 MED ORDER — POTASSIUM CHLORIDE IN NACL 20-0.9 MEQ/L-% IV SOLN
INTRAVENOUS | Status: DC
  Filled 2023-08-04 (×3): qty 1000

## 2023-08-04 MED ORDER — INSULIN GLARGINE-YFGN 100 UNIT/ML ~~LOC~~ SOLN
20.0000 [IU] | Freq: Once | SUBCUTANEOUS | Status: AC
  Administered 2023-08-04: 20 [IU] via SUBCUTANEOUS
  Filled 2023-08-04: qty 0.2

## 2023-08-04 MED ORDER — POTASSIUM CHLORIDE 10 MEQ/100ML IV SOLN
10.0000 meq | INTRAVENOUS | Status: AC
  Administered 2023-08-04 (×2): 10 meq via INTRAVENOUS
  Filled 2023-08-04 (×2): qty 100

## 2023-08-04 MED ORDER — TAMSULOSIN HCL 0.4 MG PO CAPS
0.4000 mg | ORAL_CAPSULE | Freq: Every day | ORAL | Status: DC
Start: 2023-08-04 — End: 2023-08-06
  Administered 2023-08-04 – 2023-08-06 (×3): 0.4 mg via ORAL
  Filled 2023-08-04 (×3): qty 1

## 2023-08-04 MED ORDER — INSULIN GLARGINE-YFGN 100 UNIT/ML ~~LOC~~ SOLN
15.0000 [IU] | Freq: Every day | SUBCUTANEOUS | Status: DC
  Administered 2023-08-04: 15 [IU] via SUBCUTANEOUS
  Filled 2023-08-04: qty 0.15

## 2023-08-04 MED ORDER — HYDRALAZINE HCL 20 MG/ML IJ SOLN
10.0000 mg | Freq: Four times a day (QID) | INTRAMUSCULAR | Status: DC | PRN
  Administered 2023-08-04: 10 mg via INTRAVENOUS
  Filled 2023-08-04: qty 1

## 2023-08-04 MED ORDER — LISINOPRIL 10 MG PO TABS: 20.0000 mg | ORAL_TABLET | Freq: Every day | ORAL | Status: DC

## 2023-08-04 MED ORDER — HYDRALAZINE HCL 20 MG/ML IJ SOLN
20.0000 mg | Freq: Four times a day (QID) | INTRAMUSCULAR | Status: DC | PRN
  Administered 2023-08-04: 20 mg via INTRAVENOUS
  Filled 2023-08-04: qty 1

## 2023-08-04 NOTE — Progress Notes (Signed)
Nutrition Education Note  RD consulted for nutrition education regarding diabetes.   No results found for: "HGBA1C" *pending  At time of visit, patient states he has been throwing up all morning and is nauseous. Had been on a carb modified diet but now downgraded to clear liquids with ongoing N/V.   Patient not appropriate for education at this time. Left "Carbohydrate Counting for People with Diabetes" and "Using Nutrition Labels: Carbohydrate" handouts from the Academy of [employer]etics at bedside for patient.   Once patient's diet advanced to Carb Modified and patient better able to participate in diet education, please re-consult.   Shelle Iron RD, LDN Contact via [employer].

## 2023-08-04 NOTE — Progress Notes (Signed)
    Patient Name: Charles Farmer           DOB: [birth year]  MRN: [id number]      Admission Date: 08/03/2023  Attending Provider: Calvert Cantor, MD  Primary Diagnosis: Hyperosmolar hyperglycemic state (HHS) [facility])   Level of care: Stepdown    CROSS COVER NOTE   Date of Service   08/04/2023   Charles Farmer, 53 y.o. male, was admitted on 08/03/2023 for Hyperosmolar hyperglycemic state (HHS) (HCC).    HPI/Events of Note   Urinary retention Only voiding ~100 cc. Large residual volume post- void. Bladder scan reading > 700 cc.  Currently complaining of lower abdominal discomfort and difficulty voiding.  Has been refusing Foley catheter/In-N-Out cath all day.  He is now permitting Foley catheter placement. Already on Proscar and Flomax.   Hyperglycemic CBG > 600 x2.   Treating with one-time dose IV insulin.  Recheck CBG, BMP, BHA.   Addendum:    Interventions/ Plan   X        Anthoney Harada, DNP, ACNPC- AG [facility]

## 2023-08-04 NOTE — Progress Notes (Signed)
PT calling out for nurse and screaming in pain at 0703. PT states his bladder and kidneys have been killing him all night. Night shift RN gave one time dose of medication overnight for pain but this was the first time it increased to this level. PT is trembling, panicky and saying he needs to stand up to urinate. At this same time patient began projectile vomiting. PT was only able to void 50cc in the urinal. He then asked to ambulate to bathroom so that he could try to urinate and have a BM on the toilet, hat was placed in toilet to catch urine. PT has recent history of complex urological problems that has required hospitalization in the recent past. Per patient he was in the hospital for urinating blood, urology was rounding on the patient at this time, he has not seen them outpatient since that discharge but has been taking one medication as prescribed by them and only missed a few doses due to current illness.I paged provider during this time requesting intervention due to continued retention of >600cc in the bladder post void. PT refusing foley or in and out catheter at this time despite continued rise in pain, blood pressure and lab values indicating worsening kidney strain. PT has been verbally educated by the RN and MD on importance of emptying bladder and potential outcomes if this issue is not resolved.   PT finally agreeable to catheter around 1700 but states that they gave him pain medication so he could tolerate it in the past. At this time it is MDs decision to not give requested pain medication for catheter placement.   Safety zone placed for patient refusal and potential for adverse event. Will continue to assess for change in condition.

## 2023-08-04 NOTE — Plan of Care (Signed)
  Problem: Clinical Measurements: Goal: Diagnostic test results will improve Outcome: Not Progressing   Problem: Nutrition: Goal: Adequate nutrition will be maintained Outcome: Not Progressing   Problem: Coping: Goal: Level of anxiety will decrease Outcome: Not Progressing   Problem: Elimination: Goal: Will not experience complications related to urinary retention Outcome: Not Progressing   Problem: Pain Managment: Goal: General experience of comfort will improve and/or be controlled Outcome: Not Progressing

## 2023-08-04 NOTE — Progress Notes (Signed)
[facility] Progress Note  Patient: Charles Farmer     UYQ:[id number]  DOA: 08/03/2023   PCP: [facility]       Brief hospital course: This is a 53 year old male with diabetes mellitus who was sent to the hospital after PCP found him to have an elevated blood sugar and A1c of 15. In the ED he was found to have glucose of 937 and a creatinine of 1.98 with a hyponatremia.  He was admitted for HHS.  Subjective:  Feels nauseated.  Has been vomiting on and off.  No abdominal pain.  He passed a small amount of blood today not mixed with stool but states that this is likely because someone was wiping him too hard.  He states he bleeds on and off like this at home.  Assessment and Plan: Principal Problem:   Hyperosmolar hyperglycemic state  -Transition to subcu insulin- - Dietitian and diabetes coordinator consulted to assist with teaching -A1c being repeated and is pending Active Problems:  Vomiting - As needed antiemetics -Clear liquid diet -Abdomen nontender on exam without distention hold off on imaging today - hold jardiance  Mild hematochezia - one episode -Will need to continue to follow this does not appear to be a significant amount of blood at this time -Follow hemoglobin  AKI - Creatinine up to 2.30 today-baseline appears to be about 1.5 - Continue IV fluids - ? If due to urinary retention  Hypertension - cont Toprol  -Hold HCTZ and lisinopril  Urinary retention - Per nurse the patient is retaining about 700 cc of urine although he appears to be voiding as well - Proscar and Flomax being continued-the patient states that he takes 1 medication for his prostate and uses it as needed - He is refusing a Foley catheter or an In-N-Out cath -follow     Code Status: Full Code Total time on patient care: 40 min DVT prophylaxis:  SCDs Start: 08/03/23 2236 Place TED hose Start: 08/03/23 2236     Objective:   Vitals:   08/04/23 0800  08/04/23 0805 08/04/23 0900 08/04/23 1100  BP:  (!) 149/102    Pulse: 89 92 91 (!) 108  Resp: (!) 0 11 14 (!) 30  Temp: 98.1 F (36.7 C)     TempSrc: Oral     SpO2: 96% 98% 98% (!) 85%  Weight:      Height:       Filed Weights   08/03/23 1633 08/03/23 2200  Weight: 127 kg 119 kg   Exam: General exam: Appears comfortable  HEENT: oral mucosa moist Respiratory system: Clear to auscultation.  Cardiovascular system: S1 & S2 heard  Gastrointestinal system: Abdomen soft, non-tender, nondistended. Normal bowel sounds   Extremities: No cyanosis, clubbing or edema Psychiatry:  Mood & affect appropriate.      CBC: Recent Labs  Lab 08/03/23 1638 08/03/23 1741 08/04/23 0334  WBC 8.1  --  11.9*  HGB 14.7 14.6 14.4  HCT 41.4 43.0 40.8  MCV 81.5  --  82.9  PLT 328  --  318   Basic Metabolic Panel: Recent Labs  Lab 08/03/23 1638 08/03/23 1741 08/03/23 2010 08/04/23 0016 08/04/23 0334 08/04/23 0934  NA 120* 122* 134* 135 135 134*  K 4.6 6.2* 3.4* 3.2* 3.4* 3.5  CL 86*  --  99 103 103 99  CO2 23  --  24 20* 20* 21*  GLUCOSE 937*  --  350* 255* 279* 190*  BUN 29*  --  29* 28* 28* 30*  CREATININE 1.98*  --  1.85* 1.73* 1.72* 2.30*  CALCIUM 9.7  --  10.3 9.8 9.9 10.0     Scheduled Meds:  Chlorhexidine Gluconate Cloth  6 each Topical Daily   empagliflozin  10 mg Oral Daily   enoxaparin (LOVENOX) injection  60 mg Subcutaneous Q24H   finasteride  5 mg Oral Daily   insulin aspart  0-15 Units Subcutaneous TID WC   insulin glargine-yfgn  15 Units Subcutaneous Daily   lisinopril  20 mg Oral Daily   metoprolol succinate  50 mg Oral Daily   sodium chloride flush  3 mL Intravenous Q12H   sodium chloride flush  3 mL Intravenous Q12H   tamsulosin  0.4 mg Oral Daily    Imaging and lab data personally reviewed   Author: Calvert Cantor  08/04/2023 12:38 PM  To contact [facility]>   Check the care team in [facility] and look for the attending/consulting [facility] provider listed  Log  into [email or web address] and use [place]'s universal password   Go to> "[facility]"  and find provider  If you still have difficulty reaching the provider, please page the [facility] (Director on Call) for the Hospitalists listed on amion

## 2023-08-04 NOTE — Telephone Encounter (Signed)
Pharmacy Patient Advocate Encounter   Received notification that prior authorization for FreeStyle Libre 3 Sensor is required/requested.   Insurance verification completed.   The patient is insured through [employer] .   Per test claim: PA required; PA submitted to above mentioned insurance via CoverMyMeds Key/confirmation #/[facility] Status is pending

## 2023-08-04 NOTE — Plan of Care (Signed)
  Problem: Clinical Measurements: Goal: Ability to maintain clinical measurements within normal limits will improve Outcome: Not Progressing Goal: Will remain free from infection Outcome: Not Progressing Goal: Diagnostic test results will improve Outcome: Not Progressing Goal: Respiratory complications will improve Outcome: Not Progressing Goal: Cardiovascular complication will be avoided Outcome: Not Progressing   Problem: Elimination: Goal: Will not experience complications related to urinary retention Outcome: Not Progressing

## 2023-08-04 NOTE — Inpatient Diabetes Management (Signed)
Inpatient Diabetes Program Recommendations  AACE/ADA: New Consensus Statement on Inpatient Glycemic Control (2015)  Target Ranges:  Prepandial:   less than 140 mg/dL      Peak postprandial:   less than 180 mg/dL (1-2 hours)      Critically ill patients:  140 - 180 mg/dL   Lab Results  Component Value Date   GLUCAP 286 (H) 08/04/2023    Review of Glycemic Control  Diabetes history: DM 2 Outpatient Diabetes medications: Lantus newly prescribed day prior to admission from PCP, Glipizide 10 mg bid (dose increased prior to admission) Current orders for Inpatient glycemic control:  Semglee 15 units, Jardiance 10 mg Daily Novolog 0-15 units tid  Pt had PCP visit prior to coming to the hospital. PCP prescribed Lantus 20 units Daily and increased his glipizide dose to 10 units bid.  Attempted top speak with pt. He is very sleepy and lethargic and not appropriate for conversations ior insulin teaching at this time.   Since being new on insulin I ran CGM benfits check through pharmacy and the copay is $50, will check to see if pt is able to continually get the CGM if not will require a regular CBG meter.  Will attach QR code demonstrating the operation of an insulin pen for pt reference when home after d/c.   Will watch pt on current regimen.  Thanks,  Christena Deem RN, MSN, BC-ADM Inpatient Diabetes Coordinator Team Pager [phone] (8a-5p)

## 2023-08-04 NOTE — Telephone Encounter (Signed)
Patient Product/process development scientist completed.    The patient is insured through [facility]. Patient has [employer], may use a copay card, and/or apply for patient assistance if available.    Ran test claim for Lantus Pen and the current 30 day co-pay is $0.00.  Ran test claim for Dexcom G7 Sensor and Requires Prior Authorization  Ran test claim for [employer] 3 Sensor and Requires Prior Authorization  This test claim was processed through [employer]- copay amounts may vary at other pharmacies due to [employer], or as the patient moves through the different stages of their insurance plan.     Roland Earl, CPHT Pharmacy Technician III Certified Patient Advocate [facility] Patient Advocate Team Direct Number: [phone]  Fax: [phone]

## 2023-08-04 NOTE — Progress Notes (Signed)
  Hyperosmolar hyperglycemic state - BMP from 12 AM showing low potassium 3.2, low bicarb 20, elevated blood glucose 255, normal and gap 12. - Given BMP showing metabolic acidosis low bicarb 20 plan to continue insulin drip and will recheck BMP again in 4 hours to decide to initiate long-acting insulin bridging with insulin drip. - Replating IV and oral potassium.

## 2023-08-04 NOTE — Telephone Encounter (Signed)
Pharmacy Patient Advocate Encounter  Received notification from [facility] that Prior Authorization for FreeStyle Libre 3 Sensor  has been APPROVED from 08/04/2023 to 08/03/2024. Ran test claim, Copay is $50.00. This test claim was processed through [facility]- copay amounts may vary at other pharmacies due to pharmacy/plan contracts, or as the patient moves through the different stages of their insurance plan.   PA #/Case ID/Reference #: [id number]

## 2023-08-05 DIAGNOSIS — E1100 Type 2 diabetes mellitus with hyperosmolarity without nonketotic hyperglycemic-hyperosmolar coma (NKHHC): Secondary | ICD-10-CM | POA: Diagnosis not present

## 2023-08-05 LAB — CBC
HCT: 36.5 % — ABNORMAL LOW (ref 39.0–52.0)
Hemoglobin: 12.6 g/dL — ABNORMAL LOW (ref 13.0–17.0)
MCH: 29.9 pg (ref 26.0–34.0)
MCHC: 34.5 g/dL (ref 30.0–36.0)
MCV: 86.5 fL (ref 80.0–100.0)
Platelets: 290 10*3/uL (ref 150–400)
RBC: 4.22 MIL/uL (ref 4.22–5.81)
RDW: 12.8 % (ref 11.5–15.5)
WBC: 12.6 10*3/uL — ABNORMAL HIGH (ref 4.0–10.5)
nRBC: 0 % (ref 0.0–0.2)

## 2023-08-05 LAB — BASIC METABOLIC PANEL
Anion gap: 8 (ref 5–15)
BUN: 30 mg/dL — ABNORMAL HIGH (ref 6–20)
CO2: 21 mmol/L — ABNORMAL LOW (ref 22–32)
Calcium: 9 mg/dL (ref 8.9–10.3)
Chloride: 103 mmol/L (ref 98–111)
Creatinine, Ser: 2.4 mg/dL — ABNORMAL HIGH (ref 0.61–1.24)
GFR, Estimated: 32 mL/min — ABNORMAL LOW (ref >60)
Glucose, Bld: 203 mg/dL — ABNORMAL HIGH (ref 70–99)
Potassium: 3.6 mmol/L (ref 3.5–5.1)
Sodium: 132 mmol/L — ABNORMAL LOW (ref 135–145)

## 2023-08-05 LAB — GLUCOSE, CAPILLARY
Glucose-Capillary: 230 mg/dL — ABNORMAL HIGH (ref 70–99)
Glucose-Capillary: 162 mg/dL — ABNORMAL HIGH (ref 70–99)
Glucose-Capillary: 256 mg/dL — ABNORMAL HIGH (ref 70–99)
Glucose-Capillary: 116 mg/dL — ABNORMAL HIGH (ref 70–99)
Glucose-Capillary: 246 mg/dL — ABNORMAL HIGH (ref 70–99)
Glucose-Capillary: 204 mg/dL — ABNORMAL HIGH (ref 70–99)

## 2023-08-05 MED ORDER — POTASSIUM CHLORIDE CRYS ER 20 MEQ PO TBCR
40.0000 meq | EXTENDED_RELEASE_TABLET | Freq: Once | ORAL | Status: AC
  Administered 2023-08-05: 40 meq via ORAL
  Filled 2023-08-05: qty 2

## 2023-08-05 NOTE — Inpatient Diabetes Management (Signed)
Inpatient Diabetes Program Recommendations  AACE/ADA: New Consensus Statement on Inpatient Glycemic Control   Target Ranges:  Prepandial:   less than 140 mg/dL      Peak postprandial:   less than 180 mg/dL (1-2 hours)      Critically ill patients:  140 - 180 mg/dL    Latest Reference Range & Units 08/05/23 03:11 08/05/23 05:05 08/05/23 08:00  Glucose-Capillary 70 - 99 mg/dL 161 (H) 096 (H) 045 (H)    Latest Reference Range & Units 08/04/23 08:02 08/04/23 09:02 08/04/23 12:21 08/04/23 16:00 08/04/23 19:47 08/04/23 21:19 08/04/23 22:22  Glucose-Capillary 70 - 99 mg/dL 409 (H) 811 (H) 914 (H) >600 (HH) >600 (HH) 483 (H) 458 (H)    Latest Reference Range & Units 08/03/23 16:38  CO2 22 - 32 mmol/L 23  Glucose 70 - 99 mg/dL 782 (HH)  BUN 6 - 20 mg/dL 29 (H)  Creatinine 9.56 - 1.24 mg/dL 2.13 (H)  Calcium 8.9 - 10.3 mg/dL 9.7  Anion gap 5 - 15  11   Review of Glycemic Control  Diabetes history: DM2 Outpatient Diabetes medications: Glipizide XL 10 mg BID (increased from 5 mg daily on 1/16), Lantus 20 units daily (prescribed by PCP on 1/16) Current orders for Inpatient glycemic control:  Semglee 35 units daily, Novolog 0-15 units TID  Inpatient Diabetes Program Recommendations:    Insulin: Please consider increasing Semglee to 40 units daily to start 1/19 (if agreeable, please also order Semglee 5 units x1 now for total of 40 units today), increase Novolog correction to Novolog 0-20 units TID with meals, add Novolog 0-5 units at bedtime, and ordering Novolog 4 units TID with meals for meal coverage if patient eats at least 50% of meals.  NOTE: Noted consult for Diabetes Coordinator. Diabetes Coordinator is not on campus over the weekend but available by pager from 8am to 5pm for questions or concerns. Patient was ordered IV insulin which was transitioned to SQ insulin regimen on 08/04/23. Recommending to increase Semglee, increase Novolog correction scale, and add Novolog meal coverage (noted  above).  In reviewing chart, noted patient seen PCP on 08/03/23 and per office note patient was asked to increase Glipizide XL to 10 mg BID and to start Lantus 20 units daily (increase to 30 units daily after 1 week); patient was advised to go to the ER to stabilize glucose.  Will follow along while inpatient.  Thanks, Charles Penner, RN, MSN, CDCES Diabetes Coordinator Inpatient Diabetes Program [phone] (Team Pager from 8am to 5pm)

## 2023-08-05 NOTE — Plan of Care (Signed)

## 2023-08-05 NOTE — Progress Notes (Signed)
Patient refusing telemetry monitoring. Provider notified.

## 2023-08-05 NOTE — Progress Notes (Signed)
[facility] Progress Note  Patient: Charles Farmer     OZH:[id number]  DOA: 08/03/2023   PCP: [facility]       Brief hospital course: This is a 53 year old male with diabetes mellitus who was sent to the hospital after PCP found him to have an elevated blood sugar and A1c of 15. In the ED he was found to have glucose of 937 and a creatinine of 1.98 with a hyponatremia.  He was admitted for HHS.  Subjective:  Nausea improved and able to eat. Feels better after foley.  Comfortable with giving himself insulin and his wife will help as well.   Assessment and Plan: Principal Problem:   Hyperosmolar hyperglycemic state  -Transitioned to subcu insulin- - Dietitian and diabetes coordinator consulted to assist with teaching -A1c being repeated and is pending  Active Problems:  Vomiting - resolved quickly after foley placed, on regular diet now -Abdomen nontender on exam without distention hold off on imaging today - resume jardiance  Mild hematochezia - one small episode which patient relates to being wiped to hard - may have  a hemorroid- has not recurred    AKI- CKD 3b - in setting of dehydration, ACE I and U retention  -baseline appears to be about 1.5- peaked at 2.9 and improving  - Continue IV fluids - ? If due to urinary retention- he refused the foley initially - cont to follow  Hypertension - BP low- holding meds  Urinary retention - Per nurse the patient is retaining about 700 cc of urine although he appears to be voiding as well - apparently takes Proscar at home - Flomax started - can give voiding trial prior to dc     Code Status: Full Code Total time on patient care: 40 min DVT prophylaxis:  SCDs Start: 08/03/23 2236 Place TED hose Start: 08/03/23 2236     Objective:   Vitals:   08/05/23 1100 08/05/23 1200 08/05/23 1300 08/05/23 1445  BP: 107/86 104/75 125/87 108/80  Pulse: 73 73 82 92  Resp: (!) 8 13 (!) 21   Temp:   98.3 F (36.8 C)  98 F (36.7 C)  TempSrc:  Oral  Oral  SpO2: 96% 98% 96% 100%  Weight:      Height:       Filed Weights   08/03/23 1633 08/03/23 2200  Weight: 127 kg 119 kg   Exam: General exam: Appears comfortable  HEENT: oral mucosa moist Respiratory system: Clear to auscultation.  Cardiovascular system: S1 & S2 heard  Gastrointestinal system: Abdomen soft, non-tender, nondistended. Normal bowel sounds   Extremities: No cyanosis, clubbing or edema Psychiatry:  Mood & affect appropriate.       CBC: Recent Labs  Lab 08/03/23 1638 08/03/23 1741 08/04/23 0334 08/05/23 0233  WBC 8.1  --  11.9* 12.6*  HGB 14.7 14.6 14.4 12.6*  HCT 41.4 43.0 40.8 36.5*  MCV 81.5  --  82.9 86.5  PLT 328  --  318 290   Basic Metabolic Panel: Recent Labs  Lab 08/04/23 0016 08/04/23 0334 08/04/23 0934 08/04/23 2035 08/05/23 0233  NA 135 135 134* 123* 132*  K 3.2* 3.4* 3.5 3.8 3.6  CL 103 103 99 95* 103  CO2 20* 20* 21* 15* 21*  GLUCOSE 255* 279* 190* 514* 203*  BUN 28* 28* 30* 31* 30*  CREATININE 1.73* 1.72* 2.30* 2.93* 2.40*  CALCIUM 9.8 9.9 10.0 9.3 9.0     Scheduled Meds:  Chlorhexidine  Gluconate Cloth  6 each Topical Daily   enoxaparin (LOVENOX) injection  60 mg Subcutaneous Q24H   finasteride  5 mg Oral Daily   insulin aspart  0-15 Units Subcutaneous TID WC   insulin glargine-yfgn  35 Units Subcutaneous Daily   sodium chloride flush  3 mL Intravenous Q12H   tamsulosin  0.4 mg Oral Daily    Imaging and lab data personally reviewed   Author: Calvert Cantor  08/05/2023 4:05 PM  To contact [facility]>   Check the care team in [facility] and look for the attending/consulting TRH provider listed  Log into [email or web address] and use [place]'s universal password   Go to> "[facility]"  and find provider  If you still have difficulty reaching the provider, please page the [facility] (Director on Call) for the Hospitalists listed on amion

## 2023-08-06 DIAGNOSIS — E1100 Type 2 diabetes mellitus with hyperosmolarity without nonketotic hyperglycemic-hyperosmolar coma (NKHHC): Secondary | ICD-10-CM | POA: Diagnosis not present

## 2023-08-06 LAB — CBC
HCT: 36.5 % — ABNORMAL LOW (ref 39.0–52.0)
Hemoglobin: 12.3 g/dL — ABNORMAL LOW (ref 13.0–17.0)
MCH: 29.8 pg (ref 26.0–34.0)
MCHC: 33.7 g/dL (ref 30.0–36.0)
MCV: 88.4 fL (ref 80.0–100.0)
Platelets: 261 10*3/uL (ref 150–400)
RBC: 4.13 MIL/uL — ABNORMAL LOW (ref 4.22–5.81)
RDW: 12.7 % (ref 11.5–15.5)
WBC: 9 10*3/uL (ref 4.0–10.5)
nRBC: 0 % (ref 0.0–0.2)

## 2023-08-06 LAB — BASIC METABOLIC PANEL
Anion gap: 4 — ABNORMAL LOW (ref 5–15)
BUN: 27 mg/dL — ABNORMAL HIGH (ref 6–20)
CO2: 22 mmol/L (ref 22–32)
Calcium: 8.5 mg/dL — ABNORMAL LOW (ref 8.9–10.3)
Chloride: 108 mmol/L (ref 98–111)
Creatinine, Ser: 1.69 mg/dL — ABNORMAL HIGH (ref 0.61–1.24)
GFR, Estimated: 49 mL/min — ABNORMAL LOW (ref >60)
Glucose, Bld: 129 mg/dL — ABNORMAL HIGH (ref 70–99)
Potassium: 3.5 mmol/L (ref 3.5–5.1)
Sodium: 134 mmol/L — ABNORMAL LOW (ref 135–145)

## 2023-08-06 LAB — GLUCOSE, CAPILLARY
Glucose-Capillary: 132 mg/dL — ABNORMAL HIGH (ref 70–99)
Glucose-Capillary: 178 mg/dL — ABNORMAL HIGH (ref 70–99)
Glucose-Capillary: 281 mg/dL — ABNORMAL HIGH (ref 70–99)

## 2023-08-06 MED ORDER — INSULIN GLARGINE 100 UNIT/ML SOLOSTAR PEN
35.0000 [IU] | PEN_INJECTOR | Freq: Every day | SUBCUTANEOUS | 0 refills | Status: AC
Start: 2023-08-06 — End: ?

## 2023-08-06 MED ORDER — NOVOLOG FLEXPEN 100 UNIT/ML ~~LOC~~ SOPN: 2.0000 [IU] | PEN_INJECTOR | Freq: Three times a day (TID) | SUBCUTANEOUS | 2 refills | Status: AC

## 2023-08-06 MED ORDER — FINASTERIDE 5 MG PO TABS: 5.0000 mg | ORAL_TABLET | Freq: Every day | ORAL | 0 refills | Status: AC

## 2023-08-06 NOTE — Progress Notes (Signed)
The patient was unable to fully void on multiple occasions throughout the shift. He showed retention of urine up to 443 mL, and the provider was notified. An indwelling urinary catheter insertion was ordered prior to discharge, but the patient refused. The problems and risks of urinary retention were explained to the patient, but he continued to refuse. The provider was informed of the patient's catheter refusal and desire to be discharged. Another attempt was made to educate the patient on the importance of the catheter, and he refused education. The patient opted to leave AMA. The appropriate paperwork was signed and placed in chart.

## 2023-08-06 NOTE — TOC Transition Note (Addendum)
Transition of Care [facility]) - Discharge Note   Patient Details  Name: Charles Farmer MRN: [id number] Date of Birth: 1970-10-27  Transition of Care [facility]) CM/SW Contact:  Maryjean Ka, LCSW Phone Number: 08/06/2023, 5:34 PM   Clinical Narrative:    This CSW met with patient and patient's son at the bedside. This CSW introduced self and explained role of CSW. Patient was open and welcoming to this CSW despite patient being upset about the overall care that he received. Patient reported that he did not feel heard and fully treated for his medical needs during this hospital encounter. This CSW used active listening skills, provided empathy, and validated patient's feelings.  This CSW reviewed SDOH needs and patient denied needing assistance. Patient reported that he would follow up with [facility] urology. This CSW apologized on behalf of this care team. Patient thanked this CSW and informed that he has a strong support system. Patient informed that his wife would transport him home. Patient shared that he can obtain his medications, and transport to appointments. No further needs addressed.   Final next level of care: Home/Self Care     Patient Goals and CMS Choice Patient states their goals for this hospitalization and ongoing recovery are:: Regain strength, stabilization and return to the community.          Discharge Placement  Home with family support system.                  Patient and family notified of of transfer: 08/06/23  Discharge Plan and Services Additional resources added to the After Visit Summary for                                       Social Drivers of Health (SDOH) Interventions SDOH Screenings   Food Insecurity: No Food Insecurity (08/04/2023)  Housing: Low Risk  (08/04/2023)  Transportation Needs: No Transportation Needs (08/04/2023)  Utilities: Not At Risk (08/04/2023)  Financial Resource Strain: Not on File  (01/07/2022)   Received from [place], [place]  Physical Activity: Not on File (01/07/2022)   Received from [place], [place]  Social Connections: Not on File (03/30/2023)   Received from [facility]  Stress: Not on File (01/07/2022)   Received from [place], [place]  Tobacco Use: Low Risk  (08/03/2023)     Readmission Risk Interventions     No data to display
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
## 2023-08-06 NOTE — Plan of Care (Signed)
No acute events overnight. Problem: Education: Goal: Knowledge of General Education information will improve Description: Including pain rating scale, medication(s)/side effects and non-pharmacologic comfort measures Outcome: Progressing   Problem: Health Behavior/Discharge Planning: Goal: Ability to manage health-related needs will improve Outcome: Progressing   Problem: Clinical Measurements: Goal: Ability to maintain clinical measurements within normal limits will improve Outcome: Progressing Goal: Will remain free from infection Outcome: Progressing Goal: Diagnostic test results will improve Outcome: Progressing Goal: Respiratory complications will improve Outcome: Progressing Goal: Cardiovascular complication will be avoided Outcome: Progressing   Problem: Activity: Goal: Risk for activity intolerance will decrease Outcome: Progressing   Problem: Nutrition: Goal: Adequate nutrition will be maintained Outcome: Progressing   Problem: Coping: Goal: Level of anxiety will decrease Outcome: Progressing   Problem: Elimination: Goal: Will not experience complications related to bowel motility Outcome: Progressing Goal: Will not experience complications related to urinary retention Outcome: Progressing   Problem: Pain Managment: Goal: General experience of comfort will improve and/or be controlled Outcome: Progressing   Problem: Safety: Goal: Ability to remain free from injury will improve Outcome: Progressing   Problem: Skin Integrity: Goal: Risk for impaired skin integrity will decrease Outcome: Progressing   Problem: Education: Goal: Ability to describe self-care measures that may prevent or decrease complications (Diabetes Survival Skills Education) will improve Outcome: Progressing Goal: Individualized Educational Video(s) Outcome: Progressing   Problem: Coping: Goal: Ability to adjust to condition or change in health will improve Outcome: Progressing    Problem: Fluid Volume: Goal: Ability to maintain a balanced intake and output will improve Outcome: Progressing   Problem: Health Behavior/Discharge Planning: Goal: Ability to identify and utilize available resources and services will improve Outcome: Progressing Goal: Ability to manage health-related needs will improve Outcome: Progressing   Problem: Metabolic: Goal: Ability to maintain appropriate glucose levels will improve Outcome: Progressing   Problem: Nutritional: Goal: Maintenance of adequate nutrition will improve Outcome: Progressing Goal: Progress toward achieving an optimal weight will improve Outcome: Progressing   Problem: Skin Integrity: Goal: Risk for impaired skin integrity will decrease Outcome: Progressing   Problem: Tissue Perfusion: Goal: Adequacy of tissue perfusion will improve Outcome: Progressing

## 2023-08-07 LAB — HEMOGLOBIN A1C
Hgb A1c MFr Bld: >15.5 % — ABNORMAL HIGH (ref 4.8–5.6)
Mean Plasma Glucose: >398 mg/dL

## 2023-08-24 ENCOUNTER — Emergency Department (HOSPITAL_COMMUNITY)
Admission: EM | Admit: 2023-08-24 | Discharge: 2023-08-25 | Disposition: A | Discharge status: Home / Self Care | Payer: 59 | Attending: Emergency Medicine | Admitting: Emergency Medicine

## 2023-08-24 ENCOUNTER — Encounter (HOSPITAL_COMMUNITY): Payer: Self-pay | Admitting: Emergency Medicine

## 2023-08-24 DIAGNOSIS — Z794 Long term (current) use of insulin: Secondary | ICD-10-CM | POA: Insufficient documentation

## 2023-08-24 DIAGNOSIS — N3001 Acute cystitis with hematuria: Secondary | ICD-10-CM | POA: Diagnosis not present

## 2023-08-24 DIAGNOSIS — R339 Retention of urine, unspecified: Secondary | ICD-10-CM | POA: Diagnosis present

## 2023-08-24 DIAGNOSIS — Z7984 Long term (current) use of oral hypoglycemic drugs: Secondary | ICD-10-CM | POA: Diagnosis not present

## 2023-08-24 DIAGNOSIS — E1165 Type 2 diabetes mellitus with hyperglycemia: Secondary | ICD-10-CM | POA: Diagnosis not present

## 2023-08-24 LAB — URINALYSIS, ROUTINE W REFLEX MICROSCOPIC
Bilirubin Urine: NEGATIVE
Glucose, UA: >=500 mg/dL — AB
Ketones, ur: NEGATIVE mg/dL
Nitrite: NEGATIVE
Protein, ur: 100 mg/dL — AB
RBC / HPF: >50 RBC/hpf (ref 0–5)
Specific Gravity, Urine: 1.018 (ref 1.005–1.030)
WBC, UA: >50 WBC/hpf (ref 0–5)
pH: 5 (ref 5.0–8.0)

## 2023-08-24 MED ORDER — SODIUM CHLORIDE 0.9 % IV SOLN
1.0000 g | Freq: Once | INTRAVENOUS | Status: AC
  Administered 2023-08-25: 1 g via INTRAVENOUS
  Filled 2023-08-24: qty 10

## 2023-08-24 MED ORDER — FENTANYL CITRATE PF 50 MCG/ML IJ SOSY
50.0000 ug | PREFILLED_SYRINGE | Freq: Once | INTRAMUSCULAR | Status: AC
  Administered 2023-08-25: 50 ug via INTRAVENOUS
  Filled 2023-08-24: qty 1

## 2023-08-24 MED ORDER — ONDANSETRON HCL 4 MG/2ML IJ SOLN
4.0000 mg | Freq: Once | INTRAMUSCULAR | Status: AC
  Administered 2023-08-25: 4 mg via INTRAVENOUS
  Filled 2023-08-24: qty 2

## 2023-08-24 NOTE — ED Triage Notes (Signed)
 Pt here from Urology office for Urinary retention , they attempted to cath him today , just had cath removed today

## 2023-08-24 NOTE — ED Provider Triage Note (Signed)
 Emergency Medicine Provider Triage Evaluation Note  Donel Osowski , a 53 y.o. male  was evaluated in triage.  Pt complains of suprapubic pain after he had his foley catheter taken out earlier today at [facility].   He went back to [facility] and they sent him here by EMS because he was in too much pain to do anything. He states he feels much better now after a shot of toradol  w [facility].   He had a foley because my prostate is big   Review of Systems  Positive: Urinary retention  Negative: Fever  Physical Exam  BP (!) 147/99 (BP Location: Right Arm)   Pulse 84   Temp 98.2 F (36.8 C) (Oral)   Resp 18   Ht 5' 7 (1.702 m)   Wt 120.2 kg   SpO2 100%   BMI 41.50 kg/m  Gen:   Awake, no distress   Resp:  Normal effort  MSK:   Moves extremities without difficulty Other:  Non-(grossly) bloody urine at bedside. No abd ttp.   Medical Decision Making  Medically screening exam initiated at 5:54 PM.  Appropriate orders placed.  Arath Kaigler was informed that the remainder of the evaluation will be completed by another provider, this initial triage assessment does not replace that evaluation, and the importance of remaining in the ED until their evaluation is complete.  Is urinating - had a lot of burning after foley removed. Some suprapubic discomfortt but now improved.    Neldon Inoue [place], [place] 08/24/23 1758

## 2023-08-25 LAB — BASIC METABOLIC PANEL
Anion gap: 12 (ref 5–15)
BUN: 31 mg/dL — ABNORMAL HIGH (ref 6–20)
CO2: 23 mmol/L (ref 22–32)
Calcium: 9.4 mg/dL (ref 8.9–10.3)
Chloride: 93 mmol/L — ABNORMAL LOW (ref 98–111)
Creatinine, Ser: 1.71 mg/dL — ABNORMAL HIGH (ref 0.61–1.24)
GFR, Estimated: 48 mL/min — ABNORMAL LOW (ref >60)
Glucose, Bld: 566 mg/dL (ref 70–99)
Potassium: 4 mmol/L (ref 3.5–5.1)
Sodium: 128 mmol/L — ABNORMAL LOW (ref 135–145)

## 2023-08-25 LAB — CBC
HCT: 43.4 % (ref 39.0–52.0)
Hemoglobin: 15.1 g/dL (ref 13.0–17.0)
MCH: 29.2 pg (ref 26.0–34.0)
MCHC: 34.8 g/dL (ref 30.0–36.0)
MCV: 83.8 fL (ref 80.0–100.0)
Platelets: 263 10*3/uL (ref 150–400)
RBC: 5.18 MIL/uL (ref 4.22–5.81)
RDW: 12 % (ref 11.5–15.5)
WBC: 8.8 10*3/uL (ref 4.0–10.5)
nRBC: 0 % (ref 0.0–0.2)

## 2023-08-25 LAB — CBG MONITORING, ED
Glucose-Capillary: 455 mg/dL — ABNORMAL HIGH (ref 70–99)
Glucose-Capillary: 483 mg/dL — ABNORMAL HIGH (ref 70–99)
Glucose-Capillary: 324 mg/dL — ABNORMAL HIGH (ref 70–99)
Glucose-Capillary: 187 mg/dL — ABNORMAL HIGH (ref 70–99)
Glucose-Capillary: 157 mg/dL — ABNORMAL HIGH (ref 70–99)

## 2023-08-25 MED ORDER — INSULIN REGULAR(HUMAN) IN NACL 100-0.9 UT/100ML-% IV SOLN
INTRAVENOUS | Status: DC
  Administered 2023-08-25: 13 [IU]/h via INTRAVENOUS
  Filled 2023-08-25: qty 100

## 2023-08-25 MED ORDER — DEXTROSE IN LACTATED RINGERS 5 % IV SOLN: INTRAVENOUS | Status: DC

## 2023-08-25 MED ORDER — HYDROMORPHONE HCL 1 MG/ML IJ SOLN
0.5000 mg | Freq: Once | INTRAMUSCULAR | Status: AC
  Administered 2023-08-25: 0.5 mg via INTRAVENOUS
  Filled 2023-08-25: qty 1

## 2023-08-25 MED ORDER — DIAZEPAM 5 MG/ML IJ SOLN
2.5000 mg | Freq: Once | INTRAMUSCULAR | Status: AC
  Administered 2023-08-25: 2.5 mg via INTRAVENOUS
  Filled 2023-08-25: qty 2

## 2023-08-25 MED ORDER — LACTATED RINGERS IV SOLN: INTRAVENOUS | Status: DC

## 2023-08-25 MED ORDER — CEPHALEXIN 500 MG PO CAPS
1000.0000 mg | ORAL_CAPSULE | Freq: Once | ORAL | Status: DC
  Administered 2023-08-25: 1000 mg via ORAL
  Filled 2023-08-25: qty 2

## 2023-08-25 MED ORDER — LIDOCAINE HCL URETHRAL/MUCOSAL 2 % EX GEL
1.0000 | Freq: Once | CUTANEOUS | Status: AC
  Administered 2023-08-25: 1 via URETHRAL
  Filled 2023-08-25: qty 11

## 2023-08-25 MED ORDER — CEPHALEXIN 500 MG PO CAPS: 500.0000 mg | ORAL_CAPSULE | Freq: Four times a day (QID) | ORAL | 0 refills | Status: AC

## 2023-08-25 MED ORDER — DEXTROSE 50 % IV SOLN: 0.0000 mL | INTRAVENOUS | Status: DC | PRN

## 2023-08-25 NOTE — Discharge Instructions (Addendum)
 Please follow up with urology in 2 weeks to try a catheter voiding trial again.  Went to discharge you on oral antibiotics.  Your urine has been sent for culture.  If you need a different antibiotic you will be notified.  Contact a health care provider if: Your symptoms don't get better after 1-2 days of taking antibiotics. Your symptoms go away and then come back. You have a fever or chills. You vomit or feel like you may vomit. Get help right away if: You have very bad pain in your back or lower belly. You faint.

## 2023-08-25 NOTE — ED Provider Notes (Signed)
 [place] EMERGENCY DEPARTMENT AT [facility] Provider Note   CSN: [id number] Arrival date & time: 08/24/23  1631     History  Chief Complaint  Patient presents with   Urinary Retention    Charles Farmer is a 53 y.o. male with a past medical history of diabetes, recently admitted to the hospital for hyperosmolar hyperglycemic state, also has a past medical history of urine retention due to enlarged prostate.  Patient has had a catheter in place for 2 weeks and was at his urologist today.  They pulled the catheter and he immediately had burning in his urethra.  He was unable to urinate.  He was noted to have 400 mL of urine at the urologist.  They could not place a catheter.  He was sent to the emergency department.  Since that time he has been able to urinate about 100 mL at a time.  Postvoid residual here in the emergency department shows 308 mL in his bladder.  I reviewed triage ordered labs which shows urinary tract infection.  HPI     Home Medications Prior to Admission medications   Medication Sig Start Date End Date Taking? Authorizing Provider  acetaminophen  (TYLENOL ) 500 MG tablet Take 1,000 mg by mouth every 6 (six) hours as needed for mild pain (pain score 1-3) or moderate pain (pain score 4-6).   Yes [provider]  albuterol (VENTOLIN HFA) 108 (90 Base) MCG/ACT inhaler Inhale 2 puffs into the lungs every 6 (six) hours as needed for wheezing or shortness of breath.   Yes [provider]  cephALEXin  (KEFLEX ) 500 MG capsule Take 1 capsule (500 mg total) by mouth 4 (four) times daily. 08/25/23  Yes Keniya Schlotterbeck, PA-C  finasteride  (PROSCAR ) 5 MG tablet Take 1 tablet (5 mg total) by mouth daily. 08/06/23  Yes Rizwan, Saima, MD  glipiZIDE (GLUCOTROL) 10 MG tablet Take 10 mg by mouth 2 (two) times daily. 08/18/23  Yes [provider]  insulin  aspart (NOVOLOG  FLEXPEN) 100 UNIT/ML FlexPen Inject 2 Units into the skin 3 (three) times daily with  meals. Patient taking differently: Inject 5 Units into the skin 3 (three) times daily with meals. 08/06/23  Yes Rizwan, Saima, MD  insulin  glargine (LANTUS ) 100 UNIT/ML Solostar Pen Inject 35 Units into the skin daily. Patient taking differently: Inject 40 Units into the skin daily. 08/06/23  Yes Rizwan, Saima, MD  lisinopril -hydrochlorothiazide (ZESTORETIC) 20-25 MG tablet Take 1 tablet by mouth daily. 08/17/23  Yes [provider]  tamsulosin  (FLOMAX ) 0.4 MG CAPS capsule Take 1 capsule by mouth daily. 05/26/22  Yes [provider]  empagliflozin  (JARDIANCE ) 10 MG TABS tablet Take 1 tablet by mouth in the morning. Patient not taking: Reported on 08/25/2023 07/20/23   [provider]      Allergies    Other and Codeine    Review of Systems   Review of Systems  Physical Exam Updated Vital Signs BP (!) 132/100   Pulse 80   Temp 98.8 F (37.1 C) (Oral)   Resp 15   Ht 5' 7 (1.702 m)   Wt 120.2 kg   SpO2 98%   BMI 41.50 kg/m  Physical Exam Vitals and nursing note reviewed.  Constitutional:      General: He is not in acute distress.    Appearance: He is well-developed. He is not diaphoretic.  HENT:     Head: Normocephalic and atraumatic.  Eyes:     General: No scleral icterus.  Conjunctiva/sclera: Conjunctivae normal.  Cardiovascular:     Rate and Rhythm: Normal rate and regular rhythm.     Heart sounds: Normal heart sounds.  Pulmonary:     Effort: Pulmonary effort is normal. No respiratory distress.     Breath sounds: Normal breath sounds.  Abdominal:     Palpations: Abdomen is soft.     Tenderness: There is no abdominal tenderness.  Musculoskeletal:     Cervical back: Normal range of motion and neck supple.  Skin:    General: Skin is warm and dry.  Neurological:     Mental Status: He is alert.  Psychiatric:        Behavior: Behavior normal.     ED Results / Procedures / Treatments   Labs (all labs ordered are listed, but only abnormal  results are displayed) Labs Reviewed  URINALYSIS, ROUTINE W REFLEX MICROSCOPIC - Abnormal; Notable for the following components:      Result Value   APPearance TURBID (*)    Glucose, UA >=500 (*)    Hgb urine dipstick LARGE (*)    Protein, ur 100 (*)    Leukocytes,Ua LARGE (*)    Bacteria, UA MANY (*)    All other components within normal limits  BASIC METABOLIC PANEL - Abnormal; Notable for the following components:   Sodium 128 (*)    Chloride 93 (*)    Glucose, Bld 566 (*)    BUN 31 (*)    Creatinine, Ser 1.71 (*)    GFR, Estimated 48 (*)    All other components within normal limits  CBG MONITORING, ED - Abnormal; Notable for the following components:   Glucose-Capillary 483 (*)    All other components within normal limits  CBG MONITORING, ED - Abnormal; Notable for the following components:   Glucose-Capillary 455 (*)    All other components within normal limits  CBG MONITORING, ED - Abnormal; Notable for the following components:   Glucose-Capillary 324 (*)    All other components within normal limits  CBG MONITORING, ED - Abnormal; Notable for the following components:   Glucose-Capillary 187 (*)    All other components within normal limits  CBG MONITORING, ED - Abnormal; Notable for the following components:   Glucose-Capillary 157 (*)    All other components within normal limits  CBC    EKG None  Radiology No results found.  Procedures .Critical Care  Performed by: Arloa Chroman, PA-C Authorized by: Arloa Chroman, PA-C   Critical care provider statement:    Critical care time (minutes):  65   Critical care time was exclusive of:  Separately billable procedures and treating other patients   Critical care was necessary to treat or prevent imminent or life-threatening deterioration of the following conditions:  Metabolic crisis (Hyperglycemia)   Critical care was time spent personally by me on the following activities:  Development of treatment plan with  patient or surrogate, discussions with consultants, evaluation of patient's response to treatment, examination of patient, ordering and review of laboratory studies, ordering and review of radiographic studies, ordering and performing treatments and interventions, pulse oximetry, re-evaluation of patient's condition and review of old charts     Medications Ordered in ED Medications  cefTRIAXone  (ROCEPHIN ) 1 g in sodium chloride  0.9 % 100 mL IVPB (0 g Intravenous Stopped 08/25/23 0028)  fentaNYL  (SUBLIMAZE ) injection 50 mcg (50 mcg Intravenous Given 08/25/23 0001)  ondansetron  (ZOFRAN ) injection 4 mg (4 mg Intravenous Given 08/25/23 0000)  lidocaine  (XYLOCAINE ) 2 % jelly  1 Application (1 Application Urethral Given 08/25/23 0217)  HYDROmorphone  (DILAUDID ) injection 0.5 mg (0.5 mg Intravenous Given 08/25/23 0216)  diazepam  (VALIUM ) injection 2.5 mg (2.5 mg Intravenous Given 08/25/23 0216)    ED Course/ Medical Decision Making/ A&P Clinical Course as of 08/28/23 1430  Thu Aug 24, 2023  2339 Postvoid residual shows urine of 308 mL in his bladder [AH]  Fri Aug 25, 2023  0106 Glucose(!!): 566 [AH]  0117 Anion gap: 12 [AH]  0117 Glucose(!!): 566 [AH]  0144  I discussed the case with Dr. Selma. He recommends Urinary catheter.  [AH]  [phone] Discussed the need for urinary catheter with the patient reasons why it was important.  Patient states that he will allow us  to attempt catheter insertion. [AH]    Clinical Course User Index [AH] Arloa Chroman, PA-C                                 Medical Decision Making Amount and/or Complexity of Data Reviewed Labs: ordered. Decision-making details documented in ED Course.  Risk Prescription drug management.   This patient presents to the ED for concern of urethral pain and retention, this involves an extensive number of treatment options, and is a complaint that carries with it a high risk of complications and morbidity.  Prostatic hypertrophy, UTI, BPH,  stricture  Co morbidities: BPH, uncontrolled diabetes  Social Determinants of Health:   SDOH Screenings   Food Insecurity: No Food Insecurity (08/04/2023)  Housing: Low Risk  (08/04/2023)  Transportation Needs: No Transportation Needs (08/04/2023)  Utilities: Not At Risk (08/04/2023)  Financial Resource Strain: Not on File (01/07/2022)   Received from [facility], [place]  Physical Activity: Not on File (01/07/2022)   Received from [facility], [place]  Social Connections: Not on File (03/30/2023)   Received from [facility]  Stress: Not on File (01/07/2022)   Received from [facility], [place]  Tobacco Use: Low Risk  (08/24/2023)     Additional history:  {Additional history obtained from ED visits and admissions, phone discussion with Dr. Selma   Lab Tests:  I Ordered, and personally interpreted labs.  The pertinent results include: I ordered labs including serial glucose monitoring 157 at discharge initially 566 Sodium of 128 likely pseudohyponatremia in the setting of hyperglycemia. Creatinine 1.71 appears to be patient's baseline Urine appears infected, sent for culture   Imaging Studies:  I ordered imaging studies including bedside bladder scan I independently visualized and interpreted imaging which showed postvoid residual urine volume above 300 mL I agree with the radiologist interpretation   Medicines ordered and prescription drug management:  I ordered medication including  Medications  cefTRIAXone  (ROCEPHIN ) 1 g in sodium chloride  0.9 % 100 mL IVPB (0 g Intravenous Stopped 08/25/23 0028)  fentaNYL  (SUBLIMAZE ) injection 50 mcg (50 mcg Intravenous Given 08/25/23 0001)  ondansetron  (ZOFRAN ) injection 4 mg (4 mg Intravenous Given 08/25/23 0000)  lidocaine  (XYLOCAINE ) 2 % jelly 1 Application (1 Application Urethral Given 08/25/23 0217)  HYDROmorphone  (DILAUDID ) injection 0.5 mg (0.5 mg Intravenous Given 08/25/23 0216)  diazepam  (VALIUM ) injection 2.5 mg (2.5 mg Intravenous Given 08/25/23 0216)   for urinary  tract infection, urethral discomfort and bladder spasm Reevaluation of the patient after these medicines showed that the patient improved I have reviewed the patients home medicines and have made adjustments as needed  Test Considered:   I considered a CT renal stone study however did not think that this is critically necessary  at this time as this appears to be likely from his BPH  Critical Interventions:   urinary catheterization  Consultations Obtained: As per the ED course  Problem List / ED Course:     ICD-10-CM   1. Urinary retention  R33.9     2. Acute cystitis with hematuria  N30.01       MDM: Patient here with recent catheter removal and void trial without an ability to completely void his bladder, significant hyperglycemia likely driven by catheter associated urinary tract infection.  Patient started IV antibiotics.  He was given pain control and eventually agreed to catheterization to drain the bladder and help improve his chances of recovering from UTI.  Patient will follow-up in 2 weeks for void trial unless he has obstruction or other complications prior to.   Dispostion:  After consideration of the diagnostic results and the patients response to treatment, I feel that the patent would benefit from discharge with outpatient follow-up with urology treatment for UTI.  .   Final Clinical Impression(s) / ED Diagnoses Final diagnoses:  Urinary retention  Acute cystitis with hematuria    Rx / DC Orders ED Discharge Orders          Ordered    cephALEXin  (KEFLEX ) 500 MG capsule  4 times daily        08/25/23 0512              Arloa Chroman, PA-C 08/28/23 1430    Raford Lenis, MD 08/29/23 [phone]

## 2023-08-30 ENCOUNTER — Emergency Department (HOSPITAL_BASED_OUTPATIENT_CLINIC_OR_DEPARTMENT_OTHER)
Admission: EM | Admit: 2023-08-30 | Discharge: 2023-08-30 | Disposition: A | Discharge status: Home / Self Care | Payer: 59 | Attending: Emergency Medicine | Admitting: Emergency Medicine

## 2023-08-30 ENCOUNTER — Other Ambulatory Visit: Payer: Self-pay

## 2023-08-30 ENCOUNTER — Encounter (HOSPITAL_BASED_OUTPATIENT_CLINIC_OR_DEPARTMENT_OTHER): Payer: Self-pay | Admitting: Emergency Medicine

## 2023-08-30 DIAGNOSIS — Z79899 Other long term (current) drug therapy: Secondary | ICD-10-CM | POA: Diagnosis not present

## 2023-08-30 DIAGNOSIS — Z794 Long term (current) use of insulin: Secondary | ICD-10-CM | POA: Diagnosis not present

## 2023-08-30 DIAGNOSIS — E119 Type 2 diabetes mellitus without complications: Secondary | ICD-10-CM | POA: Diagnosis not present

## 2023-08-30 DIAGNOSIS — T839XXA Unspecified complication of genitourinary prosthetic device, implant and graft, initial encounter: Secondary | ICD-10-CM | POA: Diagnosis present

## 2023-08-30 DIAGNOSIS — I10 Essential (primary) hypertension: Secondary | ICD-10-CM | POA: Diagnosis not present

## 2023-08-30 NOTE — ED Triage Notes (Signed)
Reports he is urinating "around catheter". Currently taking antibiotics for UTI. Denies any new issues.

## 2023-08-30 NOTE — Discharge Instructions (Addendum)
Were able to clear the clogged Foley catheter.  Return to the emergency department if you notice you are not making urine, or you are experiencing pain related to your Foley catheter.  Try drink plenty of water over the next couple of days.  Take all of your medications as prescribed.

## 2023-08-30 NOTE — ED Provider Notes (Signed)
[place] EMERGENCY DEPARTMENT AT [facility] Provider Note  CSN: [id number] Arrival date & time: 08/30/23 1216  Chief Complaint(s) Catheter Issue  HPI Charles Farmer is a 53 y.o. male here today for issues related to his Foley catheter.  Patient has a history of BPH, states that beginning today, he began to have some blood in his Foley catheter, since has been not passing urine.   Past Medical History Past Medical History:  Diagnosis Date   Diabetes mellitus without complication (HCC)    Hemophilia (HCC)    Hypertension    Patient Active Problem List   Diagnosis Date Noted   Hyperosmolar hyperglycemic state (HHS) (HCC) 08/03/2023   Non-insulin dependent type 2 diabetes mellitus (HCC) 08/03/2023   Acute kidney injury superimposed on chronic kidney disease (HCC) 08/03/2023   Home Medication(s) Prior to Admission medications   Medication Sig Start Date End Date Taking? Authorizing Provider  acetaminophen (TYLENOL) 500 MG tablet Take 1,000 mg by mouth every 6 (six) hours as needed for mild pain (pain score 1-3) or moderate pain (pain score 4-6).    [provider]  albuterol (VENTOLIN HFA) 108 (90 Base) MCG/ACT inhaler Inhale 2 puffs into the lungs every 6 (six) hours as needed for wheezing or shortness of breath.    [provider]  cephALEXin (KEFLEX) 500 MG capsule Take 1 capsule (500 mg total) by mouth 4 (four) times daily. 08/25/23   Arthor Captain, PA-C  empagliflozin (JARDIANCE) 10 MG TABS tablet Take 1 tablet by mouth in the morning. Patient not taking: Reported on 08/25/2023 07/20/23   [provider]  finasteride (PROSCAR) 5 MG tablet Take 1 tablet (5 mg total) by mouth daily. 08/06/23   Calvert Cantor, MD  glipiZIDE (GLUCOTROL) 10 MG tablet Take 10 mg by mouth 2 (two) times daily. 08/18/23   [provider]  insulin aspart (NOVOLOG FLEXPEN) 100 UNIT/ML FlexPen Inject 2 Units into the skin 3 (three) times daily with meals. Patient taking  differently: Inject 5 Units into the skin 3 (three) times daily with meals. 08/06/23   Calvert Cantor, MD  insulin glargine (LANTUS) 100 UNIT/ML Solostar Pen Inject 35 Units into the skin daily. Patient taking differently: Inject 40 Units into the skin daily. 08/06/23   Calvert Cantor, MD  lisinopril-hydrochlorothiazide (ZESTORETIC) 20-25 MG tablet Take 1 tablet by mouth daily. 08/17/23   [provider]  tamsulosin (FLOMAX) 0.4 MG CAPS capsule Take 1 capsule by mouth daily. 05/26/22   [provider]                                                                                                                                    Past Surgical History History reviewed. No pertinent surgical history. Family History History reviewed. No pertinent family history.  Social History Social History   Tobacco Use   Smoking status: Never   Smokeless tobacco: Never  Substance Use Topics  Alcohol use: Not Currently   Drug use: Not Currently   Allergies Other and Codeine  Review of Systems Review of Systems  Physical Exam Vital Signs  I have reviewed the triage vital signs BP 109/81 (BP Location: Right Arm)   Pulse 84   Temp 97.8 F (36.6 C)   Resp 20   Ht 5\' 7"  (1.702 m)   Wt 120 kg   SpO2 98%   BMI 41.43 kg/m   Physical Exam Vitals reviewed.  Pulmonary:     Effort: Pulmonary effort is normal.  Abdominal:     General: Abdomen is flat.     Comments: Mildly distended bladder  Neurological:     Mental Status: He is alert.     ED Results and Treatments Labs (all labs ordered are listed, but only abnormal results are displayed) Labs Reviewed - No data to display                                                                                                                        Radiology No results found.  Pertinent labs & imaging results that were available during my care of the patient were reviewed by me and considered in my medical decision making (see  MDM for details).  Medications Ordered in ED Medications - No data to display                                                                                                                                   Procedures Procedures  (including critical care time)  Medical Decision Making / ED Course   This patient presents to the ED for concern of urinary retention, reporting leaking of urine around his Foley catheter, this involves an extensive number of treatment options, and is a complaint that carries with it a high risk of complications and morbidity.  The differential diagnosis includes urinary clots, occluded catheter, bladder spasms.  MDM: Did a bedside ultrasound on the patient, Foley catheter is in the correct place with balloon up.  Likely has occluded.  Will see if we can flush the catheter and get urine moving again.   Reassessment 4 PM-was able to flush the patient's catheter, there is a small amount of clotting present, and afterward patient's urine drained clear.  Patient feeling significantly improved.  Will discharge with urology follow-up.    Cardiac Monitoring: The patient was maintained on a cardiac  monitor.  I personally viewed and interpreted the cardiac monitored which showed an underlying rhythm of: Normal sinus rhythm  Social Determinants of Health:  Factors impacting patients care include:    Reevaluation: After the interventions noted above, I reevaluated the patient and found that they have :improved  Co morbidities that complicate the patient evaluation  Past Medical History:  Diagnosis Date   Diabetes mellitus without complication (HCC)    Hemophilia (HCC)    Hypertension       Dispostion: I considered admission for this patient, however patient was able to have a clot cleared, passing clear urine, is appropriate for outpatient follow-up.     Final Clinical Impression(s) / ED Diagnoses Final diagnoses:  Problem with Foley catheter,  initial encounter (HCC)     @PCDICTATION @    Anders Simmonds T, DO 08/30/23 1611

## 2023-09-01 ENCOUNTER — Other Ambulatory Visit: Payer: Self-pay

## 2023-09-01 ENCOUNTER — Encounter (HOSPITAL_BASED_OUTPATIENT_CLINIC_OR_DEPARTMENT_OTHER): Payer: Self-pay

## 2023-09-01 ENCOUNTER — Emergency Department (HOSPITAL_BASED_OUTPATIENT_CLINIC_OR_DEPARTMENT_OTHER)
Admission: EM | Admit: 2023-09-01 | Discharge: 2023-09-01 | Disposition: A | Discharge status: Home / Self Care | Payer: 59 | Attending: Emergency Medicine | Admitting: Emergency Medicine

## 2023-09-01 DIAGNOSIS — T83091A Other mechanical complication of indwelling urethral catheter, initial encounter: Secondary | ICD-10-CM | POA: Insufficient documentation

## 2023-09-01 DIAGNOSIS — Y828 Other medical devices associated with adverse incidents: Secondary | ICD-10-CM | POA: Insufficient documentation

## 2023-09-01 DIAGNOSIS — T839XXA Unspecified complication of genitourinary prosthetic device, implant and graft, initial encounter: Secondary | ICD-10-CM

## 2023-09-01 NOTE — ED Notes (Signed)
Bladde scan noted to be 225 mL. Catheter irrigated with assistance of [place], Charity fundraiser. Pt tolerated procedure well. Urine aspiration noted. Leg bag changed to new bag. Urine output noted in leg bag after irrigation. EDP notified.

## 2023-09-01 NOTE — ED Triage Notes (Signed)
States onset today catheter not draining well.  Was here two days ago and had catheter flushed  Having pain in bladder.

## 2023-09-01 NOTE — Discharge Instructions (Addendum)
Please follow-up with your urologist as planned.  Continue home medications.

## 2023-09-01 NOTE — ED Provider Notes (Signed)
[place] EMERGENCY DEPARTMENT AT [facility] Provider Note   CSN: [id number] Arrival date & time: 09/01/23  1148     History  Chief Complaint  Patient presents with   catheter problems    Charles Farmer is a 53 y.o. male.  Patient with history of BPH, hemophilia -- presents to the emergency department for evaluation of poor output through his Foley catheter.  Patient was seen and had a Foley placed about a week ago for urinary retention.  He represented 2 days ago for a blocked catheter.  This was irrigated.  Patient has noted some clots flow-through at times.  He feels that since 8 AM today, he has had slow throughput although he has filled up about half of the leg bag.  He has not noticed as much blood.  He did have a clot come out yesterday.  He feels lower abdominal pressure and the urge to urinate.  Patient states that he is on antibiotics currently for UTI.       Home Medications Prior to Admission medications   Medication Sig Start Date End Date Taking? Authorizing Provider  acetaminophen (TYLENOL) 500 MG tablet Take 1,000 mg by mouth every 6 (six) hours as needed for mild pain (pain score 1-3) or moderate pain (pain score 4-6).    [provider]  albuterol (VENTOLIN HFA) 108 (90 Base) MCG/ACT inhaler Inhale 2 puffs into the lungs every 6 (six) hours as needed for wheezing or shortness of breath.    [provider]  cephALEXin (KEFLEX) 500 MG capsule Take 1 capsule (500 mg total) by mouth 4 (four) times daily. 08/25/23   Arthor Captain, PA-C  empagliflozin (JARDIANCE) 10 MG TABS tablet Take 1 tablet by mouth in the morning. Patient not taking: Reported on 08/25/2023 07/20/23   [provider]  finasteride (PROSCAR) 5 MG tablet Take 1 tablet (5 mg total) by mouth daily. 08/06/23   Calvert Cantor, MD  glipiZIDE (GLUCOTROL) 10 MG tablet Take 10 mg by mouth 2 (two) times daily. 08/18/23   [provider]  insulin aspart (NOVOLOG FLEXPEN) 100  UNIT/ML FlexPen Inject 2 Units into the skin 3 (three) times daily with meals. Patient taking differently: Inject 5 Units into the skin 3 (three) times daily with meals. 08/06/23   Calvert Cantor, MD  insulin glargine (LANTUS) 100 UNIT/ML Solostar Pen Inject 35 Units into the skin daily. Patient taking differently: Inject 40 Units into the skin daily. 08/06/23   Calvert Cantor, MD  lisinopril-hydrochlorothiazide (ZESTORETIC) 20-25 MG tablet Take 1 tablet by mouth daily. 08/17/23   [provider]  tamsulosin (FLOMAX) 0.4 MG CAPS capsule Take 1 capsule by mouth daily. 05/26/22   [provider]      Allergies    Other and Codeine    Review of Systems   Review of Systems  Physical Exam Updated Vital Signs BP 95/74 (BP Location: Right Arm)   Pulse 86   Temp 98.3 F (36.8 C) (Oral)   Resp 16   Ht 5\' 7"  (1.702 m)   Wt 120 kg   SpO2 98%   BMI 41.43 kg/m  Physical Exam Vitals and nursing note reviewed.  Constitutional:      Appearance: He is well-developed.  HENT:     Head: Normocephalic and atraumatic.  Eyes:     Conjunctiva/sclera: Conjunctivae normal.  Pulmonary:     Effort: No respiratory distress.  Abdominal:     Tenderness: There is abdominal tenderness.     Comments:  Suprapubic tenderness  Genitourinary:    Comments: Urine is yellow without signs of bleeding or clots, he has filled slightly more than half of the leg bag. Musculoskeletal:     Cervical back: Normal range of motion and neck supple.  Skin:    General: Skin is warm and dry.  Neurological:     Mental Status: He is alert.     ED Results / Procedures / Treatments   Labs (all labs ordered are listed, but only abnormal results are displayed) Labs Reviewed - No data to display  EKG None  Radiology No results found.  Procedures Procedures    Medications Ordered in ED Medications - No data to display  ED Course/ Medical Decision Making/ A&P    Patient seen and examined. History  obtained directly from patient.  Patient would like his Foley irrigated.  He does not want the tube replaced at this time due to pain.  Labs/EKG: None ordered  Imaging: None ordered  Medications/Fluids: None ordered  Most recent vital signs reviewed and are as follows: BP 95/74 (BP Location: Right Arm)   Pulse 86   Temp 98.3 F (36.8 C) (Oral)   Resp 16   Ht 5\' 7"  (1.702 m)   Wt 120 kg   SpO2 98%   BMI 41.43 kg/m   Initial impression: Possibly partially obstructed Foley catheter, will irrigate and reassess.  1:18 PM Per RN, good output after irrigation.   1:27 PM Reassessment performed. Patient appears comfortable.  Feels much better.  Reviewed pertinent lab work and imaging with patient at bedside. Questions answered.   Most current vital signs reviewed and are as follows: BP 95/74 (BP Location: Right Arm)   Pulse 86   Temp 98.3 F (36.8 C) (Oral)   Resp 16   Ht 5\' 7"  (1.702 m)   Wt 120 kg   SpO2 98%   BMI 41.43 kg/m   Plan: Discharge to home.   Prescriptions written for: None  Other home care instructions discussed: Monitoring of symptoms  ED return instructions discussed: Reobstruction, new or worsening symptoms  Follow-up instructions discussed: Patient encouraged to follow-up with their urologist as planned in about 6 days.                                  Medical Decision Making  Foley catheter with partial obstruction, flushed with good results.  Patient does not want the catheter change.  Recently on antibiotics for UTI.  Has follow-up with PCP.        Final Clinical Impression(s) / ED Diagnoses Final diagnoses:  Problem with Foley catheter, initial encounter [facility])    Rx / DC Orders ED Discharge Orders     None         Renne Crigler, PA-C 09/01/23 1328    Pricilla Loveless, MD 09/01/23 1427

## 2023-09-10 ENCOUNTER — Emergency Department (HOSPITAL_COMMUNITY)
Admission: EM | Admit: 2023-09-10 | Discharge: 2023-09-11 | Disposition: A | Discharge status: Home / Self Care | Payer: 59 | Attending: Emergency Medicine | Admitting: Emergency Medicine

## 2023-09-10 ENCOUNTER — Encounter (HOSPITAL_COMMUNITY): Payer: Self-pay | Admitting: Emergency Medicine

## 2023-09-10 ENCOUNTER — Other Ambulatory Visit: Payer: Self-pay

## 2023-09-10 DIAGNOSIS — R310 Gross hematuria: Secondary | ICD-10-CM

## 2023-09-10 DIAGNOSIS — R319 Hematuria, unspecified: Secondary | ICD-10-CM | POA: Diagnosis present

## 2023-09-10 DIAGNOSIS — D66 Hereditary factor VIII deficiency: Secondary | ICD-10-CM | POA: Diagnosis not present

## 2023-09-10 NOTE — ED Triage Notes (Signed)
 Patient c/o hematuria and suprapubic pain.  Patient has chronic foley.  Patient also has history of hemophilia and reports his medication is expired and he needs new medication.

## 2023-09-11 LAB — BASIC METABOLIC PANEL
Anion gap: 5 (ref 5–15)
BUN: 27 mg/dL — ABNORMAL HIGH (ref 6–20)
CO2: 26 mmol/L (ref 22–32)
Calcium: 9.2 mg/dL (ref 8.9–10.3)
Chloride: 100 mmol/L (ref 98–111)
Creatinine, Ser: 1.76 mg/dL — ABNORMAL HIGH (ref 0.61–1.24)
GFR, Estimated: 47 mL/min — ABNORMAL LOW (ref >60)
Glucose, Bld: 254 mg/dL — ABNORMAL HIGH (ref 70–99)
Potassium: 4 mmol/L (ref 3.5–5.1)
Sodium: 131 mmol/L — ABNORMAL LOW (ref 135–145)

## 2023-09-11 LAB — CBC WITH DIFFERENTIAL/PLATELET
Abs Immature Granulocytes: 0.02 10*3/uL (ref 0.00–0.07)
Basophils Absolute: 0.1 10*3/uL (ref 0.0–0.1)
Basophils Relative: 1 %
Eosinophils Absolute: 0.2 10*3/uL (ref 0.0–0.5)
Eosinophils Relative: 3 %
HCT: 38.4 % — ABNORMAL LOW (ref 39.0–52.0)
Hemoglobin: 13.1 g/dL (ref 13.0–17.0)
Immature Granulocytes: 0 %
Lymphocytes Relative: 46 %
Lymphs Abs: 2.8 10*3/uL (ref 0.7–4.0)
MCH: 29.6 pg (ref 26.0–34.0)
MCHC: 34.1 g/dL (ref 30.0–36.0)
MCV: 86.7 fL (ref 80.0–100.0)
Monocytes Absolute: 0.7 10*3/uL (ref 0.1–1.0)
Monocytes Relative: 12 %
Neutro Abs: 2.2 10*3/uL (ref 1.7–7.7)
Neutrophils Relative %: 38 %
Platelets: 312 10*3/uL (ref 150–400)
RBC: 4.43 MIL/uL (ref 4.22–5.81)
RDW: 12 % (ref 11.5–15.5)
WBC: 6 10*3/uL (ref 4.0–10.5)
nRBC: 0 % (ref 0.0–0.2)

## 2023-09-11 LAB — URINALYSIS, ROUTINE W REFLEX MICROSCOPIC: RBC / HPF: >50 RBC/hpf (ref 0–5)

## 2023-09-11 LAB — PROTIME-INR
INR: 1.1 (ref 0.8–1.2)
Prothrombin Time: 14.7 s (ref 11.4–15.2)

## 2023-09-11 LAB — APTT: aPTT: 36 s (ref 24–36)

## 2023-09-11 MED ORDER — ANTIHEM FACTOR RECOMB (RFVIII) 500 UNITS IV KIT: 6000.0000 [IU] | PACK | Freq: Once | INTRAVENOUS | Status: DC

## 2023-09-11 MED ORDER — SODIUM CHLORIDE 0.9 % IV SOLN
1.0000 g | Freq: Once | INTRAVENOUS | Status: AC
  Administered 2023-09-11: 1 g via INTRAVENOUS
  Filled 2023-09-11: qty 10

## 2023-09-11 MED ORDER — ANTIHEM FACT (BDD-RFVIII,MOR) 500 UNITS IV KIT
5888.0000 [IU] | PACK | Freq: Once | INTRAVENOUS | Status: AC
  Administered 2023-09-11: 5888 [IU] via INTRAVENOUS
  Filled 2023-09-11: qty 5888

## 2023-09-11 NOTE — ED Provider Notes (Signed)
 [place] EMERGENCY DEPARTMENT AT [facility] Provider Note   CSN: [id number] Arrival date & time: 09/10/23  2145     History  Chief Complaint  Patient presents with   Hematuria    Charles Farmer is a 53 y.o. male.  The history is provided by the patient.  Hematuria  Charles Farmer is a 53 y.o. male who presents to the Emergency Department complaining of hematuria.  He presents to the emergency department for evaluation of hematuria that started earlier today.  He has associated lower abdominal cramping as well.  He is passing clots and is needing to flush his catheter at home.  He has had an indwelling Foley catheter in place since January 23.  No associated fever, nausea, vomiting.  He does have a history of hemophilia.  His factor replacement at home expired in January, so he he did not administer any.  He has had a similar issue in the past, which required factor replacement.     Home Medications Prior to Admission medications   Medication Sig Start Date End Date Taking? Authorizing Provider  acetaminophen (TYLENOL) 500 MG tablet Take 1,000 mg by mouth every 6 (six) hours as needed for mild pain (pain score 1-3) or moderate pain (pain score 4-6).    [provider]  albuterol (VENTOLIN HFA) 108 (90 Base) MCG/ACT inhaler Inhale 2 puffs into the lungs every 6 (six) hours as needed for wheezing or shortness of breath.    [provider]  cephALEXin (KEFLEX) 500 MG capsule Take 1 capsule (500 mg total) by mouth 4 (four) times daily. 08/25/23   Arthor Captain, PA-C  empagliflozin (JARDIANCE) 10 MG TABS tablet Take 1 tablet by mouth in the morning. Patient not taking: Reported on 08/25/2023 07/20/23   [provider]  finasteride (PROSCAR) 5 MG tablet Take 1 tablet (5 mg total) by mouth daily. 08/06/23   Calvert Cantor, MD  glipiZIDE (GLUCOTROL) 10 MG tablet Take 10 mg by mouth 2 (two) times daily. 08/18/23   [provider]  insulin aspart (NOVOLOG  FLEXPEN) 100 UNIT/ML FlexPen Inject 2 Units into the skin 3 (three) times daily with meals. Patient taking differently: Inject 5 Units into the skin 3 (three) times daily with meals. 08/06/23   Calvert Cantor, MD  insulin glargine (LANTUS) 100 UNIT/ML Solostar Pen Inject 35 Units into the skin daily. Patient taking differently: Inject 40 Units into the skin daily. 08/06/23   Calvert Cantor, MD  lisinopril-hydrochlorothiazide (ZESTORETIC) 20-25 MG tablet Take 1 tablet by mouth daily. 08/17/23   [provider]  tamsulosin (FLOMAX) 0.4 MG CAPS capsule Take 1 capsule by mouth daily. 05/26/22   [provider]      Allergies    Other and Codeine    Review of Systems   Review of Systems  Genitourinary:  Positive for hematuria.  All other systems reviewed and are negative.   Physical Exam Updated Vital Signs BP 101/81   Pulse 66   Temp 98.2 F (36.8 C) (Oral)   Resp 18   Wt 120 kg   SpO2 99%   BMI 41.43 kg/m  Physical Exam Vitals and nursing note reviewed.  Constitutional:      Appearance: He is well-developed.  HENT:     Head: Normocephalic and atraumatic.  Cardiovascular:     Rate and Rhythm: Normal rate and regular rhythm.  Pulmonary:     Effort: Pulmonary effort is normal.     Breath sounds: Normal breath sounds.  Abdominal:     Palpations: Abdomen is soft.     Tenderness: There is no guarding or rebound.     Comments: Mild suprapubic tenderness  Genitourinary:    Comments: Gross blood in catheter tubing Musculoskeletal:        General: No tenderness.  Skin:    General: Skin is warm and dry.  Neurological:     Mental Status: He is alert and oriented to person, place, and time.  Psychiatric:        Behavior: Behavior normal.     ED Results / Procedures / Treatments   Labs (all labs ordered are listed, but only abnormal results are displayed) Labs Reviewed  URINALYSIS, ROUTINE W REFLEX MICROSCOPIC - Abnormal; Notable for the following components:       Result Value   Color, Urine RED (*)    APPearance CLOUDY (*)    Glucose, UA   (*)    Value: TEST NOT REPORTED DUE TO COLOR INTERFERENCE OF URINE PIGMENT   Hgb urine dipstick   (*)    Value: TEST NOT REPORTED DUE TO COLOR INTERFERENCE OF URINE PIGMENT   Bilirubin Urine   (*)    Value: TEST NOT REPORTED DUE TO COLOR INTERFERENCE OF URINE PIGMENT   Ketones, ur   (*)    Value: TEST NOT REPORTED DUE TO COLOR INTERFERENCE OF URINE PIGMENT   Protein, ur   (*)    Value: TEST NOT REPORTED DUE TO COLOR INTERFERENCE OF URINE PIGMENT   Nitrite   (*)    Value: TEST NOT REPORTED DUE TO COLOR INTERFERENCE OF URINE PIGMENT   Leukocytes,Ua   (*)    Value: TEST NOT REPORTED DUE TO COLOR INTERFERENCE OF URINE PIGMENT   Bacteria, UA RARE (*)    All other components within normal limits  CBC WITH DIFFERENTIAL/PLATELET - Abnormal; Notable for the following components:   HCT 38.4 (*)    All other components within normal limits  BASIC METABOLIC PANEL - Abnormal; Notable for the following components:   Sodium 131 (*)    Glucose, Bld 254 (*)    BUN 27 (*)    Creatinine, Ser 1.76 (*)    GFR, Estimated 47 (*)    All other components within normal limits  URINE CULTURE  PROTIME-INR  APTT    EKG None  Radiology No results found.  Procedures Procedures    Medications Ordered in ED Medications  antihemophilic factor VIII (recomb) (XYNTHA) injection 5,888 Units (5,888 Units Intravenous Given 09/11/23 0358)  cefTRIAXone (ROCEPHIN) 1 g in sodium chloride 0.9 % 100 mL IVPB (0 g Intravenous Stopped 09/11/23 0535)    ED Course/ Medical Decision Making/ A&P                                 Medical Decision Making Amount and/or Complexity of Data Reviewed Labs: ordered.  Risk Prescription drug management.   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  He does have a chronic indwelling catheter in place.  Catheter does demonstrate frank blood.  This was  irrigated once with saline and cleared.  He was treated with factor VIII replacement per recommendations in care everywhere from oncology at [facility].  CBC without significant anemia.  CMP with stable renal insufficiency.  UA does have bacteria present, no significant leukocytes.  Will send a culture.  Will treat with a one-time dose of  antibiotics given his acute hematuria, bladder irrigation.   No recurrent after irrigation.  He was treated with 6000 units of factor replacement.  Plan to have him discharged home with close outpatient hematology follow-up and return precautions.       Final Clinical Impression(s) / ED Diagnoses Final diagnoses:  Gross hematuria  Hemophilia A [facility])    Rx / DC Orders ED Discharge Orders     None         Tilden Fossa, MD 09/11/23 [phone]

## 2023-09-12 LAB — URINE CULTURE: Culture: <10000 — AB

## 2023-10-19 ENCOUNTER — Emergency Department (HOSPITAL_BASED_OUTPATIENT_CLINIC_OR_DEPARTMENT_OTHER)
Admission: EM | Admit: 2023-10-19 | Discharge: 2023-10-19 | Disposition: A | Discharge status: Home / Self Care | Attending: Emergency Medicine | Admitting: Emergency Medicine

## 2023-10-19 ENCOUNTER — Emergency Department (HOSPITAL_BASED_OUTPATIENT_CLINIC_OR_DEPARTMENT_OTHER)

## 2023-10-19 ENCOUNTER — Other Ambulatory Visit: Payer: Self-pay

## 2023-10-19 ENCOUNTER — Encounter (HOSPITAL_BASED_OUTPATIENT_CLINIC_OR_DEPARTMENT_OTHER): Payer: Self-pay

## 2023-10-19 DIAGNOSIS — Z79899 Other long term (current) drug therapy: Secondary | ICD-10-CM | POA: Insufficient documentation

## 2023-10-19 DIAGNOSIS — M7989 Other specified soft tissue disorders: Secondary | ICD-10-CM | POA: Diagnosis present

## 2023-10-19 DIAGNOSIS — Z794 Long term (current) use of insulin: Secondary | ICD-10-CM | POA: Diagnosis not present

## 2023-10-19 DIAGNOSIS — E1165 Type 2 diabetes mellitus with hyperglycemia: Secondary | ICD-10-CM | POA: Diagnosis not present

## 2023-10-19 DIAGNOSIS — R600 Localized edema: Secondary | ICD-10-CM | POA: Diagnosis not present

## 2023-10-19 DIAGNOSIS — Z7984 Long term (current) use of oral hypoglycemic drugs: Secondary | ICD-10-CM | POA: Diagnosis not present

## 2023-10-19 DIAGNOSIS — R7989 Other specified abnormal findings of blood chemistry: Secondary | ICD-10-CM | POA: Diagnosis not present

## 2023-10-19 DIAGNOSIS — I10 Essential (primary) hypertension: Secondary | ICD-10-CM | POA: Insufficient documentation

## 2023-10-19 HISTORY — DX: Benign prostatic hyperplasia without lower urinary tract symptoms: N40.0

## 2023-10-19 LAB — CBC
HCT: 42 % (ref 39.0–52.0)
Hemoglobin: 14.4 g/dL (ref 13.0–17.0)
MCH: 29.8 pg (ref 26.0–34.0)
MCHC: 34.3 g/dL (ref 30.0–36.0)
MCV: 86.8 fL (ref 80.0–100.0)
Platelets: 292 10*3/uL (ref 150–400)
RBC: 4.84 MIL/uL (ref 4.22–5.81)
RDW: 12.9 % (ref 11.5–15.5)
WBC: 5.7 10*3/uL (ref 4.0–10.5)
nRBC: 0 % (ref 0.0–0.2)

## 2023-10-19 LAB — BASIC METABOLIC PANEL WITH GFR
Anion gap: 7 (ref 5–15)
BUN: 17 mg/dL (ref 6–20)
CO2: 28 mmol/L (ref 22–32)
Calcium: 9.7 mg/dL (ref 8.9–10.3)
Chloride: 101 mmol/L (ref 98–111)
Creatinine, Ser: 1.45 mg/dL — ABNORMAL HIGH (ref 0.61–1.24)
GFR, Estimated: 59 mL/min — ABNORMAL LOW (ref >60)
Glucose, Bld: 213 mg/dL — ABNORMAL HIGH (ref 70–99)
Potassium: 4.1 mmol/L (ref 3.5–5.1)
Sodium: 136 mmol/L (ref 135–145)

## 2023-10-19 LAB — BRAIN NATRIURETIC PEPTIDE: B Natriuretic Peptide: 40.1 pg/mL (ref 0.0–100.0)

## 2023-10-19 MED ORDER — FUROSEMIDE 20 MG PO TABS: 20.0000 mg | ORAL_TABLET | Freq: Two times a day (BID) | ORAL | 0 refills | Status: AC

## 2023-10-19 NOTE — ED Provider Notes (Signed)
 [place] EMERGENCY DEPARTMENT AT [facility] Provider Note   CSN: [id number] Arrival date & time: 10/19/23  4098     History  Chief Complaint  Patient presents with   Leg Swelling    Charles Farmer is a 53 y.o. male.  HPI   Patient has a history of hypertension diabetes hemophilia.  He presents to the ED for evaluation of leg swelling.  Patient states he takes diuretics.  He has not missed any doses.  When he woke up this morning he noted swelling mostly in his right leg but some in his left.  He denies any chest pain or shortness of breath.  No history of DVT.  Patient denies any joint pain or swelling.  No fevers or chills.  No chest pain or shortness of breath  Home Medications Prior to Admission medications   Medication Sig Start Date End Date Taking? Authorizing Provider  furosemide (LASIX) 20 MG tablet Take 1 tablet (20 mg total) by mouth 2 (two) times daily for 5 days. 10/19/23 10/24/23 Yes Linwood Dibbles, MD  acetaminophen (TYLENOL) 500 MG tablet Take 1,000 mg by mouth every 6 (six) hours as needed for mild pain (pain score 1-3) or moderate pain (pain score 4-6).    [provider]  albuterol (VENTOLIN HFA) 108 (90 Base) MCG/ACT inhaler Inhale 2 puffs into the lungs every 6 (six) hours as needed for wheezing or shortness of breath.    [provider]  cephALEXin (KEFLEX) 500 MG capsule Take 1 capsule (500 mg total) by mouth 4 (four) times daily. 08/25/23   Arthor Captain, PA-C  finasteride (PROSCAR) 5 MG tablet Take 1 tablet (5 mg total) by mouth daily. 08/06/23   Calvert Cantor, MD  glipiZIDE (GLUCOTROL) 10 MG tablet Take 10 mg by mouth 2 (two) times daily. 08/18/23   [provider]  insulin aspart (NOVOLOG FLEXPEN) 100 UNIT/ML FlexPen Inject 2 Units into the skin 3 (three) times daily with meals. Patient taking differently: Inject 5 Units into the skin 3 (three) times daily with meals. 08/06/23   Calvert Cantor, MD  insulin glargine (LANTUS) 100 UNIT/ML  Solostar Pen Inject 35 Units into the skin daily. Patient taking differently: Inject 40 Units into the skin daily. 08/06/23   Calvert Cantor, MD  lisinopril-hydrochlorothiazide (ZESTORETIC) 20-25 MG tablet Take 1 tablet by mouth daily. 08/17/23   [provider]  metoprolol succinate (TOPROL-XL) 50 MG 24 hr tablet Take 50 mg by mouth daily.    [provider]  tamsulosin (FLOMAX) 0.4 MG CAPS capsule Take 1 capsule by mouth daily. 05/26/22   [provider]      Allergies    Other and Codeine    Review of Systems   Review of Systems  Physical Exam Updated Vital Signs BP 105/76   Pulse (!) 53   Temp 98.1 F (36.7 C) (Oral)   Resp 14   SpO2 100%  Physical Exam Vitals and nursing note reviewed.  Constitutional:      General: He is not in acute distress.    Appearance: He is well-developed.  HENT:     Head: Normocephalic and atraumatic.     Right Ear: External ear normal.     Left Ear: External ear normal.  Eyes:     General: No scleral icterus.       Right eye: No discharge.        Left eye: No discharge.     Conjunctiva/sclera: Conjunctivae normal.  Neck:  Trachea: No tracheal deviation.  Cardiovascular:     Rate and Rhythm: Normal rate and regular rhythm.  Pulmonary:     Effort: Pulmonary effort is normal. No respiratory distress.     Breath sounds: Normal breath sounds. No stridor. No wheezing or rales.  Abdominal:     General: Bowel sounds are normal. There is no distension.     Palpations: Abdomen is soft.     Tenderness: There is no abdominal tenderness. There is no guarding or rebound.  Musculoskeletal:        General: No tenderness or deformity.     Cervical back: Neck supple.     Right lower leg: Edema present.     Left lower leg: Edema present.     Comments: Pitting edema noted bilateral lower extremities below the knee right greater than left, no joint swelling, no tenderness, no erythema  Skin:    General: Skin is warm and dry.      Findings: No rash.  Neurological:     General: No focal deficit present.     Mental Status: He is alert.     Cranial Nerves: No cranial nerve deficit, dysarthria or facial asymmetry.     Sensory: No sensory deficit.     Motor: No abnormal muscle tone or seizure activity.     Coordination: Coordination normal.  Psychiatric:        Mood and Affect: Mood normal.     ED Results / Procedures / Treatments   Labs (all labs ordered are listed, but only abnormal results are displayed) Labs Reviewed  BASIC METABOLIC PANEL WITH GFR - Abnormal; Notable for the following components:      Result Value   Glucose, Bld 213 (*)    Creatinine, Ser 1.45 (*)    GFR, Estimated 59 (*)    All other components within normal limits  CBC  BRAIN NATRIURETIC PEPTIDE    EKG None  Radiology US Venous Img Lower Bilateral (DVT) Result Date: 10/19/2023 CLINICAL DATA:  Bilateral lower extremity edema EXAM: BILATERAL LOWER EXTREMITY VENOUS DOPPLER ULTRASOUND TECHNIQUE: Gray-scale sonography with graded compression, as well as color Doppler and duplex ultrasound were performed to evaluate the lower extremity deep venous systems from the level of the common femoral vein and including the common femoral, femoral, profunda femoral, popliteal and calf veins including the posterior tibial, peroneal and gastrocnemius veins when visible. The superficial great saphenous vein was also interrogated. Spectral Doppler was utilized to evaluate flow at rest and with distal augmentation maneuvers in the common femoral, femoral and popliteal veins. COMPARISON:  None Available. FINDINGS: RIGHT LOWER EXTREMITY Common Femoral Vein: No evidence of thrombus. Normal compressibility, respiratory phasicity and response to augmentation. Saphenofemoral Junction: No evidence of thrombus. Normal compressibility and flow on color Doppler imaging. Profunda Femoral Vein: No evidence of thrombus. Normal compressibility and flow on color Doppler  imaging. Femoral Vein: No evidence of thrombus. Normal compressibility, respiratory phasicity and response to augmentation. Popliteal Vein: No evidence of thrombus. Normal compressibility, respiratory phasicity and response to augmentation. Calf Veins: No evidence of thrombus. Normal compressibility and flow on color Doppler imaging. Superficial Great Saphenous Vein: No evidence of thrombus. Normal compressibility. Venous Reflux:  None. Other Findings:  None. LEFT LOWER EXTREMITY Common Femoral Vein: No evidence of thrombus. Normal compressibility, respiratory phasicity and response to augmentation. Saphenofemoral Junction: No evidence of thrombus. Normal compressibility and flow on color Doppler imaging. Profunda Femoral Vein: No evidence of thrombus. Normal compressibility and flow on color Doppler imaging.  Femoral Vein: No evidence of thrombus. Normal compressibility, respiratory phasicity and response to augmentation. Popliteal Vein: No evidence of thrombus. Normal compressibility, respiratory phasicity and response to augmentation. Calf Veins: No evidence of thrombus. Normal compressibility and flow on color Doppler imaging. Superficial Great Saphenous Vein: No evidence of thrombus. Normal compressibility. Venous Reflux:  None. Other Findings:  None. IMPRESSION: No evidence of deep venous thrombosis in either lower extremity. Electronically Signed   By: Malachy Moan M.D.   On: 10/19/2023 08:49   DG Chest Portable 1 View Result Date: 10/19/2023 CLINICAL DATA:  leg swelling. EXAM: PORTABLE CHEST 1 VIEW COMPARISON:  02/07/2009. FINDINGS: Mild diffuse pulmonary vascular congestion. No frank pulmonary edema. Bilateral lung fields are clear. Bilateral costophrenic angles are clear. Normal cardio-mediastinal silhouette. No acute osseous abnormalities. The soft tissues are within normal limits. IMPRESSION: Mild diffuse pulmonary vascular congestion. No frank pulmonary edema. Electronically Signed   By: Jules Schick M.D.   On: 10/19/2023 08:27    Procedures Procedures    Medications Ordered in ED Medications - No data to display  ED Course/ Medical Decision Making/ A&P Clinical Course as of 10/19/23 1105  Thu Oct 19, 2023  1022 Basic metabolic panel(!) Creatinine increased glucose elevated.  No signs of DKA [JK]  1023 Creatinine is improved compared to previous.  CBC normal. [JK]  1023 No evidence of DVT [JK]  1023 Chest x-ray with vascular congestion but no signs of pulmonary edema [JK]    Clinical Course User Index [JK] Linwood Dibbles, MD                                 Medical Decision Making Problems Addressed: Peripheral edema: acute illness or injury that poses a threat to life or bodily functions  Amount and/or Complexity of Data Reviewed Labs: ordered. Decision-making details documented in ED Course. Radiology: ordered and independent interpretation performed.  Risk Prescription drug management.   Patient presented to the ED for evaluation of leg swelling.  Consider the possibility of DVT cellulitis peripheral edema.  Patient not have any breathing difficulty.  No findings to suggest pulmonary edema on x-ray.  Patient does have noted vascular congestion.  Patient is supposed to be on Lasix.  He was not sure of the dose.  Patient appears appropriate for discharge.  Will have him increase Lasix dosing for the last few days.  I was able to see notes from his primary care doctor's office that did mention a 10 mg as needed Lasix dose.  Recommend outpatient follow-up with his PCP to be rechecked.        Final Clinical Impression(s) / ED Diagnoses Final diagnoses:  Peripheral edema    Rx / DC Orders ED Discharge Orders          Ordered    furosemide (LASIX) 20 MG tablet  2 times daily        10/19/23 1105              Linwood Dibbles, MD 10/19/23 [phone]

## 2023-10-19 NOTE — ED Triage Notes (Signed)
 Pt presents via POV c/o bilateral leg swelling since this am. Denies CHF and/or Gout.

## 2023-10-19 NOTE — ED Notes (Signed)
 Xray in room, now transporting to ultrasound

## 2023-10-19 NOTE — ED Notes (Signed)
 Dc instructions reviewed with patient. Patient voiced understanding. Dc with belongings.

## 2023-10-19 NOTE — ED Notes (Signed)
 Unsuccessful attempt iv x1,pt  now in bathroom

## 2023-10-19 NOTE — Discharge Instructions (Addendum)
 Take the medications to help with the swelling in your lower legs.  The Lasix medication will make you urinate more.  You can also try wearing compression stockings that you can get at a pharmacy or medical supply store.  This can also help reduce the swelling.  You would wear them when you are up and walking and then take them off when resting and at night.  Follow-up with your primary care doctor in the next week to be rechecked.  Return to the ER for trouble breathing or other concerning symptoms
